# Patient Record
Sex: Female | Born: 1985 | Race: White | Hispanic: No | Marital: Married | Smoking: Never smoker
Health system: Southern US, Community
[De-identification: ages and names within clinical notes are randomized; demographics above are authoritative.]

## PROBLEM LIST (undated history)

## (undated) DIAGNOSIS — F419 Anxiety disorder, unspecified: Secondary | ICD-10-CM

## (undated) DIAGNOSIS — K50813 Crohn's disease of both small and large intestine with fistula: Secondary | ICD-10-CM

## (undated) DIAGNOSIS — J45909 Unspecified asthma, uncomplicated: Secondary | ICD-10-CM

## (undated) HISTORY — PX: PILONIDAL CYST EXCISION: SHX744

## (undated) HISTORY — DX: Crohn's disease of both small and large intestine with fistula: K50.813

## (undated) HISTORY — PX: COLONOSCOPY: SHX174

## (undated) HISTORY — PX: BARTHOLIN GLAND CYST EXCISION: SHX565

## (undated) HISTORY — PX: WISDOM TOOTH EXTRACTION: SHX21

---

## 2005-03-31 ENCOUNTER — Ambulatory Visit (HOSPITAL_COMMUNITY): Admission: RE | Admit: 2005-03-31 | Discharge: 2005-03-31 | Payer: Self-pay | Admitting: *Deleted

## 2005-03-31 ENCOUNTER — Encounter (INDEPENDENT_AMBULATORY_CARE_PROVIDER_SITE_OTHER): Payer: Self-pay | Admitting: *Deleted

## 2005-03-31 ENCOUNTER — Ambulatory Visit (HOSPITAL_BASED_OUTPATIENT_CLINIC_OR_DEPARTMENT_OTHER): Admission: RE | Admit: 2005-03-31 | Discharge: 2005-03-31 | Payer: Self-pay | Admitting: *Deleted

## 2006-05-27 ENCOUNTER — Inpatient Hospital Stay (HOSPITAL_COMMUNITY): Admission: AD | Admit: 2006-05-27 | Discharge: 2006-05-29 | Payer: Self-pay | Admitting: Obstetrics and Gynecology

## 2006-05-28 ENCOUNTER — Encounter (INDEPENDENT_AMBULATORY_CARE_PROVIDER_SITE_OTHER): Payer: Self-pay | Admitting: *Deleted

## 2006-09-15 ENCOUNTER — Encounter (INDEPENDENT_AMBULATORY_CARE_PROVIDER_SITE_OTHER): Payer: Self-pay | Admitting: *Deleted

## 2006-09-15 ENCOUNTER — Ambulatory Visit (HOSPITAL_COMMUNITY): Admission: RE | Admit: 2006-09-15 | Discharge: 2006-09-15 | Payer: Self-pay | Admitting: Obstetrics and Gynecology

## 2007-12-08 ENCOUNTER — Other Ambulatory Visit: Admission: RE | Admit: 2007-12-08 | Discharge: 2007-12-08 | Payer: Self-pay | Admitting: Obstetrics and Gynecology

## 2008-12-08 ENCOUNTER — Other Ambulatory Visit: Admission: RE | Admit: 2008-12-08 | Discharge: 2008-12-08 | Payer: Self-pay | Admitting: Obstetrics and Gynecology

## 2010-01-21 ENCOUNTER — Other Ambulatory Visit: Admission: RE | Admit: 2010-01-21 | Discharge: 2010-01-21 | Payer: Self-pay | Admitting: Obstetrics and Gynecology

## 2011-02-14 NOTE — Op Note (Signed)
NAMEMarland Kitchen  Alexandria Garcia, Alexandria Garcia NO.:  [id number]   MEDICAL RECORD NO.:  [id number]          PATIENT TYPE:  INP   LOCATION:  A326                          FACILITY:  [facility]   PHYSICIAN:  Tilda Burrow, M.D. DATE OF BIRTH:  14-Mar-1986   DATE OF PROCEDURE:  05/28/2006  DATE OF DISCHARGE:                                 OPERATIVE REPORT   PREOPERATIVE DIAGNOSIS:  Right Bartholin's abscess.   POSTOPERATIVE DIAGNOSIS:  Right Bartholin's abscess.   OPERATION PERFORMED:  Excision of Bartholin's cyst, right labia majora.   SURGEON:  Tilda Burrow, M.D.   ASSISTANTAmie Critchley, CST   ANESTHESIA:  General.   COMPLICATIONS:  None.   FINDINGS:  Fibrotic tract from external drainage site to internal drainage  site just at hymen remnants.   DESCRIPTION OF PROCEDURE:  The patient was taken to the operating room and  placed in high lithotomy leg support and prepped and draped.  The perineum  was examined while the patient was anesthetized and the firm fibrotic tract  delineated.  A hemostat could be used to cannulate the tract originating  from the opening up nearest to the hymen on the right side. This reached  almost to the external opening.  It was felt that excision would be  preferable to drainage efforts.  Therefore a semicircular 3 cm incision was  made over the external opening coring out the fistulous tract and the  fibrotic area surrounding it.  Once the ellipse was allowed the development  of a flap of skin, we were able to undermine and resect and core out a 4 cm  long by 3 cm wide tubular mass of fibrous tissue.  We did this while  maintaining hemostat in the internal opening so that we could maintain  orientation.  Once this was cored out almost completely.  We cut the  specimen open in order to ensure that we were peeling out the entire  opening.  It appeared we were taking out the entire Bartholin's abscess and  surrounding fibrotic tissue.  Once this had been  completed, there was some  pulsatile arterial bleeding that required two horizontal mattress sutures of  3-0 Dexon to achieve some hemostasis.  The defect was then closed with a  series of interrupted 3-0 and 2-0 Dexon sutures rebuilding the perineal  body.  Subcuticular 3-0 Dexon was then used to close the external opening.  The internal opening was trimmed in such a way as to reduce some retraction  that had been present due to its chronic nature.  This was cored out  sufficiently and loosely put together with suture to allow blood and fluid  egress if necessary but allow tissue edges to come in close enough  approximation to heal smoothly.  There was only slight asymmetry of the  introitus due to the scarring and surgical correction.  The estimated blood  loss was 50 mL.  Sponge and needle counts were correct.   ADDENDUM:  After the end of the procedure, the patient's pilonidal cyst was  inspected, massaged enough to express some of the purulence but no  cannulization performed.  The patient will see Dr. Lovell Sheehan in anticipation  of a Christmas break surgery to correct this.      Tilda Burrow, M.D.  Electronically Signed     JVF/MEDQ  D:  05/28/2006  T:  05/29/2006  Job:  [id number]   cc:   Dalia Heading, M.D.  Fax: [phone]   __________, OB/GYN

## 2011-02-14 NOTE — H&P (Signed)
NAMEMarland Kitchen  Alexandria Garcia, Alexandria Garcia NO.:  [id number]   MEDICAL RECORD NO.:  [id number]          PATIENT TYPE:  AMB   LOCATION:  DAY                           FACILITY:  [facility]   PHYSICIAN:  Tilda Burrow, M.D. DATE OF BIRTH:  June 02, 1986   DATE OF ADMISSION:  09/15/2006  DATE OF DISCHARGE:  LH                              HISTORY & PHYSICAL   ADMISSION DIAGNOSES:  Right Bartholin's abscess, recurrent.   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 This has been excised once in  the past after initially incision and drainage in our office November 21, 2005 with surgical removal attempted while it was acutely inflamed  May 27, 2006. Unfortunately, despite our best efforts, the fibrotic  area removed did not heal properly and she has a chronic draining sinus,  just outside the hymen remnant on the right. This does not communicate  with the rectum and is persistently tender. We have allowed it to mature  with several weeks of antibiotic therapy. It does not flare up as long  as she stays on antibiotics. We are going to attempt excision and  drainage at this time. The patient has a history of chronic poor  healing. She has had a total of 4 efforts to remove a pilonidal cyst,  which have been unsuccessful. She does not carry any diagnosis of  autoimmune disorder at the present time.   ALLERGIES:  NO KNOWN DRUG ALLERGIES.   MEDICATIONS:  None other than Ortho-Tri-Cyclen.   PHYSICAL EXAMINATION:  VITAL SIGNS:  Height 5'3. Weight 140.  GENERAL:  Examination shows a tearful Caucasian female, remarkably  accepting of her problems. She is alert and oriented x3.  HEENT:  Pupils are equal, round, and reactive.  NECK:  Supple.  CARDIOVASCULAR:  Unremarkable.  ABDOMEN:  Non-tender.  GENITOURINARY:  External genitalia with the previous scarring from  the  excision efforts done in the past. The right side has two skin scars,  one for the chronic draining sinus, just outside the introitus and a  separate scar that has healed over, approximately 2 cm out lateral on  the right buttock. There is no perirectal induration or tenderness. This  is not felt to be rectovaginal in origin, but either a sebaceous cyst or  more likely, the Bartholin's abscess with poor  healing. At this time, her pilonidal cyst is asymptomatic and only  drains intermittently and patient declines offer of correcting this at  the same time through surgical consultation.   PLAN:  Excision of Bartholin's abscess and fistulous sinus on September 15, 2006 at 7:30 a.m.      Tilda Burrow, M.D.  Electronically Signed     JVF/MEDQ  D:  09/09/2006  T:  09/09/2006  Job:  [id number]   cc:   [facility]   Dalia Heading, M.D.  Fax: [phone]   [facility]

## 2011-02-14 NOTE — Discharge Summary (Signed)
NAMEMarland Kitchen  KATHE, WIRICK NO.:  [id number]   MEDICAL RECORD NO.:  [id number]          PATIENT TYPE:  INP   LOCATION:  A326                          FACILITY:  [facility]   PHYSICIAN:  Tilda Burrow, M.D. DATE OF BIRTH:  [birth year]   DATE OF ADMISSION:  05/27/2006  DATE OF DISCHARGE:  08/31/2007LH                                 DISCHARGE SUMMARY   ADMISSION DIAGNOSIS:  Recurrent right Bartholin's abscess.   DISCHARGE DIAGNOSIS:  Recurrent right Bartholin's abscess.   PROCEDURES:  Incision and drainage of Bartholin's abscess May 28, 2006.   DISCHARGE MEDICATIONS:  1. Keflex 500 q.i.d. x14 days.  2. Tylox 20 tablets one q.4h p.r.n. severe pain.  3. Motrin 800 mg q.8 h p.r.n. mild pain.  4. Diflucan 150 mg weekly x2.  5. Ortho Tri-Cyclen load to be continued.   HOSPITAL SUMMARY:  This 25 year old gravida 0, para 0 on Ortho Tri-Cyclen  load with know recurrent Bartholin's abscess was admitted for pain  management after we could not adequately address the current Bartholin's  abscess induration as an outpatient.  She received IV antibiotics overnight,  was afebrile, had hemoglobin of 12, hematocrit 35, was taken to the OR the  next day for excision of right Bartholin's' abscess.  The wound was closed  at the time of surgery after irrigation.  The patient was doing well enough  to go home the next day with follow up scheduled for two weeks.  She intends  to go back to school on Tuesday.  Will be calling our office for any changes  in status or worsening of discomfort today.  At discharge, she looks fine,  is afebrile, tolerating a regular diet with minimal discomfort.      Tilda Burrow, M.D.  Electronically Signed     JVF/MEDQ  D:  05/29/2006  T:  05/29/2006  Job:  [id number]   cc:   [facility]

## 2011-02-14 NOTE — H&P (Signed)
NAMEMarland Garcia  CARRI, SPILLERS NO.:  [id number]   MEDICAL RECORD NO.:  [id number]          PATIENT TYPE:  INP   LOCATION:  A326                          FACILITY:  [facility]   PHYSICIAN:  Tilda Burrow, M.D. DATE OF BIRTH:  1986-09-16   DATE OF ADMISSION:  05/27/2006  DATE OF DISCHARGE:  LH                                HISTORY & PHYSICAL   ADMISSION DIAGNOSES:  1. Recurrent right Bartholin's abscess.  2. Recurrent pilonidal cyst.   HISTORY OF PRESENT ILLNESS:  This 25 year old female, gravida 0, para 0, LMP  May 06, 2006, on Ortho Tri-Cyclen Lo, student at [employer] is  admitted at this time for incision and drainage of Bartholin's abscess.  She  is seen in the office today, and the cyst was just too tender to effectively  treat in the office.  She had been seen in February of this year with what  appeared to be a gluteal abscess.  This was incised and drained.  She was  seen subsequently by Dr. _Mcleod__ and had a Bartholin's abscess further up  which has been drained.  This was treated without a wick or Word catheter.  The patient has subsequently had recurrence of inflammation, tenderness, and  swelling.  Anatomy does not allow [place] to access the area of involvement  easily.  She is exquisitely tender, and we cannot get good opening into the  abscess pocket.  Local anesthesia efforts in the office were unsuccessful  due to the severity of the pain, so we are admitting her for antibiotic  therapy overnight and incision and drainage in the OR in a.m.  Additionally,  she has a recurrent pilonidal cyst despite 4 efforts already in 2005 and  2006, and this will be evaluated by Dr. Lovell Sheehan.  We may leave this to a  later date, depending on findings and decision.   The patient is a Consulting civil engineer at [employer].  School is just beginning, and  she does not wish to be out of school for prolonged period of time if  possible.  Once before she had to have daily packings of  pilonidal cyst  incision and drainage for several weeks.  Unfortunately, the cyst recurred.   PAST MEDICAL HISTORY:  Benign other than the cyst and Bartholin's abscess.   PAST SURGICAL HISTORY:  Pilonidal cystectomy September 2005, November 2005,  June 2006, and July 2006.  She has had recurrence, nonetheless, of pilonidal  cyst.   INJURIES:  None.   ALLERGIES:  None.   MEDICATIONS:  Ortho Tri-Cyclen Lo.   PHYSICAL EXAMINATION:  VITAL SIGNS:  Height 5 feet 3 inches, weight 146.  Blood pressure 110/62.  GENERAL:  She is a moderately uncomfortable Caucasian female.  HEENT:  Pupils equal, round, and reactive.  NECK:  Supple.  CARDIOVASCULAR:  Exam unremarkable.  ABDOMEN:  Nontender.  PELVIC:  External genitalia show a draining pilonidal cyst without a huge  amount of erythema, evidence of prior surgical treatments.  The right labia  majora and perineal body is swollen, and there is a drainage site that has  already been  massaged and is expressing purulent material.  Additionally,  purulent material was seen at the pilonidal cyst site and expressed these.  The two sites do not communicate.  EXTREMITIES: Grossly normal.   IMPRESSION:  1. Recurrent pilonidal cyst.  2. Recurrent right Bartholin's abscess.   PLAN:  We will focus on trying the eliminate the recurrences of the  Bartholin's abscess.  Possibility of surgical excision of the area of  induration and infection as well as possibility of simple incision and  drainage with replacement of Word catheter have been reviewed with patient.  We will decide intraoperatively depending on the clinical findings and  success of dissection.      Tilda Burrow, M.D.  Electronically Signed     JVF/MEDQ  D:  05/27/2006  T:  05/27/2006  Job:  [id number]

## 2011-02-14 NOTE — Op Note (Signed)
NAMEMarland Kitchen  Garcia, Alexandria Garcia            ACCOUNT NO.:  [id number]   MEDICAL RECORD NO.:  [id number]          PATIENT TYPE:  AMB   LOCATION:  NESC                         [facility]ACILITY[facility]   PHYSICIAN:  Vikki Ports, MDDATE OF BIRTH:  1986-08-13   DATE OF PROCEDURE:  03/31/2005  DATE OF DISCHARGE:                                 OPERATIVE REPORT   PREOPERATIVE DIAGNOSIS:  Recurrent pilonidal cyst.   POSTOPERATIVE DIAGNOSIS:  Recurrent pilonidal cyst.   PROCEDURE:  Recurrent pilonidal cystectomy.   SUBJECTIVE:  Vikki Ports, MD.   ANESTHESIA:  General.   DESCRIPTION OF PROCEDURE:  The patient was taken to the operating room and  placed in supine position.  After adequate anesthesia was induced using  endotracheal  tube, the patient was placed in the prone position.  Superior  gluteal cuff was prepped and draped in normal sterile fashion.  Using an  elliptical  incision around the previous scar, I dissected down onto the  coccygeal fascia and sacral fascia excising all tissue in this area.  This  was removed in block and adequate hemostasis was ensured.  Flaps were  created, and it was closed in two layers using interrupted 2-0 Vicryl in the  deep layer, and the skin was interrupted vertical mattress sutures with 2-0  nylon.  I also used some staples in the skin.  Sterile dressing was applied.  The patient tolerated the procedure well, went to PACU in good condition.       KRH/MEDQ  D:  03/31/2005  T:  03/31/2005  Job:  [id number]

## 2011-02-14 NOTE — Op Note (Signed)
NAMEMarland Kitchen  Alexandria, Garcia NO.:  [id number]   MEDICAL RECORD NO.:  [id number]          PATIENT TYPE:  AMB   LOCATION:  DAY                           FACILITY:  [facility]   PHYSICIAN:  Tilda Burrow, M.D. DATE OF BIRTH:  1986/07/23   DATE OF PROCEDURE:  09/15/2006  DATE OF DISCHARGE:  09/15/2006                               OPERATIVE REPORT   PREOPERATIVE DIAGNOSIS:  Recurrent Bartholin's abscess/ovarian cyst.   POSTOPERATIVE DIAGNOSIS:  Recurrent Bartholin's abscess/ovarian cyst.   OPERATION/PROCEDURE:  Excision of ovarian cyst sinus tract.   SURGEON:  Tilda Burrow, M.D.   ASSISTANT:  __________ , CST.   ANESTHESIA:  General.   COMPLICATIONS:  None.   SPECIMENS:  To lab.   ESTIMATED BLOOD LOSS:  Minimal.   DESCRIPTION OF PROCEDURE:  The patient was taken to the operating room,  prepped and draped for vulvar procedure.  The area of the prior  recurrent vulvar abscess (? Bartholin's abscess) was easily visible on  the right side of the introitus with the most proximal opening just  outside the hymen remnants and the distal opening, approximately 2.5 cm  external and lateral.  There was minimal erythema and the patient had  some small amount of purulent drainage from the more proximal opening.  The external scarring was closed and was not draining.  Elliptical  incisions were made around each of the openings both proximal and  distal.  We then worked from the most distal and lateral site coring out  the fibrous fistulous tract, leaving fatty tissue that was clear and  healthy in appearance all the way around it.  This was cored out and an  Allis clamp used to place traction on it so that we could completely  remove the fistulous tract.  At the end of the dissection, only fatty  tissue remained and the fistulous tract was removed intact from one end  to the other.  The area was irrigated copiously, then the potential  space obliterated  by subcuticular 3-0  Dexon sutures used to close potential space.  The  skin incisions were closed with subcuticular 4-0 Dexon.  Lateral tibial  plateau tolerated the procedure well and went to the recovery room in  good condition.  Specimen was sent for histology.      Tilda Burrow, M.D.  Electronically Signed     JVF/MEDQ  D:  10/09/2006  T:  10/09/2006  Job:  [id number]

## 2011-05-05 ENCOUNTER — Other Ambulatory Visit: Payer: Self-pay | Admitting: Adult Health

## 2011-05-05 ENCOUNTER — Other Ambulatory Visit (HOSPITAL_COMMUNITY)
Admission: RE | Admit: 2011-05-05 | Discharge: 2011-05-05 | Disposition: A | Discharge status: Home / Self Care | Payer: Commercial Indemnity | Source: Ambulatory Visit | Attending: Obstetrics and Gynecology | Admitting: Obstetrics and Gynecology

## 2011-05-05 DIAGNOSIS — Z113 Encounter for screening for infections with a predominantly sexual mode of transmission: Secondary | ICD-10-CM | POA: Insufficient documentation

## 2011-05-05 DIAGNOSIS — Z01419 Encounter for gynecological examination (general) (routine) without abnormal findings: Secondary | ICD-10-CM | POA: Insufficient documentation

## 2012-02-19 ENCOUNTER — Other Ambulatory Visit: Payer: Self-pay | Admitting: Gastroenterology

## 2012-02-19 DIAGNOSIS — L988 Other specified disorders of the skin and subcutaneous tissue: Secondary | ICD-10-CM

## 2012-02-29 ENCOUNTER — Ambulatory Visit
Admission: RE | Admit: 2012-02-29 | Discharge: 2012-02-29 | Disposition: A | Discharge status: Home / Self Care | Payer: Commercial Indemnity | Source: Ambulatory Visit | Attending: Gastroenterology | Admitting: Gastroenterology

## 2012-02-29 DIAGNOSIS — L988 Other specified disorders of the skin and subcutaneous tissue: Secondary | ICD-10-CM

## 2012-02-29 MED ORDER — GADOBENATE DIMEGLUMINE 529 MG/ML IV SOLN
14.0000 mL | Freq: Once | INTRAVENOUS | Status: AC | PRN
  Administered 2012-02-29: 14 mL via INTRAVENOUS

## 2014-04-06 ENCOUNTER — Other Ambulatory Visit (HOSPITAL_COMMUNITY): Payer: Self-pay | Admitting: *Deleted

## 2014-04-07 ENCOUNTER — Encounter (HOSPITAL_COMMUNITY)
Admission: RE | Admit: 2014-04-07 | Discharge: 2014-04-07 | Disposition: A | Discharge status: Home / Self Care | Payer: PRIVATE HEALTH INSURANCE | Source: Ambulatory Visit | Attending: Gastroenterology | Admitting: Gastroenterology

## 2014-04-07 DIAGNOSIS — K603 Anal fistula, unspecified: Secondary | ICD-10-CM | POA: Insufficient documentation

## 2014-04-07 DIAGNOSIS — K5090 Crohn's disease, unspecified, without complications: Secondary | ICD-10-CM | POA: Diagnosis not present

## 2014-04-07 MED ORDER — SODIUM CHLORIDE 0.9 % IV SOLN
INTRAVENOUS | Status: DC
  Administered 2014-04-07: 250 mL via INTRAVENOUS

## 2014-04-07 MED ORDER — SODIUM CHLORIDE 0.9 % IV SOLN
5.0000 mg/kg | INTRAVENOUS | Status: DC
  Administered 2014-04-07: 400 mg via INTRAVENOUS
  Filled 2014-04-07: qty 40

## 2014-04-13 ENCOUNTER — Other Ambulatory Visit (HOSPITAL_COMMUNITY): Payer: Self-pay

## 2014-04-14 ENCOUNTER — Encounter (HOSPITAL_COMMUNITY)
Admission: RE | Admit: 2014-04-14 | Discharge: 2014-04-14 | Disposition: A | Discharge status: Home / Self Care | Payer: PRIVATE HEALTH INSURANCE | Source: Ambulatory Visit | Attending: Gastroenterology | Admitting: Gastroenterology

## 2014-04-14 DIAGNOSIS — K5090 Crohn's disease, unspecified, without complications: Secondary | ICD-10-CM | POA: Diagnosis not present

## 2014-04-14 MED ORDER — SODIUM CHLORIDE 0.9 % IV SOLN
5.0000 mg/kg | Freq: Once | INTRAVENOUS | Status: AC
  Administered 2014-04-14: 400 mg via INTRAVENOUS
  Filled 2014-04-14: qty 40

## 2014-04-14 MED ORDER — SODIUM CHLORIDE 0.9 % IV SOLN
INTRAVENOUS | Status: DC
  Administered 2014-04-14: 11:00:00 via INTRAVENOUS

## 2014-05-12 ENCOUNTER — Inpatient Hospital Stay (HOSPITAL_COMMUNITY): Admission: RE | Admit: 2014-05-12 | Payer: Commercial Indemnity | Source: Ambulatory Visit

## 2014-05-19 ENCOUNTER — Encounter (HOSPITAL_COMMUNITY)
Admission: RE | Admit: 2014-05-19 | Discharge: 2014-05-19 | Disposition: A | Discharge status: Home / Self Care | Payer: PRIVATE HEALTH INSURANCE | Source: Ambulatory Visit | Attending: Gastroenterology | Admitting: Gastroenterology

## 2014-05-19 DIAGNOSIS — K5090 Crohn's disease, unspecified, without complications: Secondary | ICD-10-CM | POA: Diagnosis present

## 2014-05-19 DIAGNOSIS — K603 Anal fistula, unspecified: Secondary | ICD-10-CM | POA: Insufficient documentation

## 2014-05-19 MED ORDER — SODIUM CHLORIDE 0.9 % IV SOLN
Freq: Once | INTRAVENOUS | Status: AC
  Administered 2014-05-19: 250 mL via INTRAVENOUS

## 2014-05-19 MED ORDER — SODIUM CHLORIDE 0.9 % IV SOLN
5.0000 mg/kg | Freq: Once | INTRAVENOUS | Status: AC
  Administered 2014-05-19: 400 mg via INTRAVENOUS
  Filled 2014-05-19: qty 40

## 2014-06-02 ENCOUNTER — Encounter (HOSPITAL_COMMUNITY)
Admission: RE | Admit: 2014-06-02 | Discharge: 2014-06-02 | Disposition: A | Discharge status: Home / Self Care | Payer: PRIVATE HEALTH INSURANCE | Source: Ambulatory Visit | Attending: Gastroenterology | Admitting: Gastroenterology

## 2014-06-02 DIAGNOSIS — K603 Anal fistula, unspecified: Secondary | ICD-10-CM | POA: Insufficient documentation

## 2014-06-02 DIAGNOSIS — K5090 Crohn's disease, unspecified, without complications: Secondary | ICD-10-CM | POA: Diagnosis present

## 2014-06-02 MED ORDER — SODIUM CHLORIDE 0.9 % IV SOLN
Freq: Once | INTRAVENOUS | Status: AC
  Administered 2014-06-02: 250 mL via INTRAVENOUS

## 2014-06-02 MED ORDER — SODIUM CHLORIDE 0.9 % IV SOLN
5.0000 mg/kg | Freq: Once | INTRAVENOUS | Status: AC
  Administered 2014-06-02: 400 mg via INTRAVENOUS
  Filled 2014-06-02: qty 40

## 2014-07-28 ENCOUNTER — Inpatient Hospital Stay (HOSPITAL_COMMUNITY): Admission: RE | Admit: 2014-07-28 | Payer: Commercial Indemnity | Source: Ambulatory Visit

## 2014-09-05 ENCOUNTER — Encounter (INDEPENDENT_AMBULATORY_CARE_PROVIDER_SITE_OTHER): Payer: Self-pay

## 2014-09-05 ENCOUNTER — Encounter: Payer: Self-pay | Admitting: Gastroenterology

## 2014-09-05 ENCOUNTER — Ambulatory Visit (INDEPENDENT_AMBULATORY_CARE_PROVIDER_SITE_OTHER): Payer: PRIVATE HEALTH INSURANCE | Admitting: Gastroenterology

## 2014-09-05 VITALS — BP 121/78 | HR 86 | Temp 98.1°F | Ht 63.0 in | Wt 171.6 lb

## 2014-09-05 DIAGNOSIS — K50813 Crohn's disease of both small and large intestine with fistula: Secondary | ICD-10-CM

## 2014-09-05 NOTE — Patient Instructions (Signed)
1. I will discuss your case with Dr. Oneida Alar and get back in touch with you with further recommendations once Dr. Oneida Alar is back in town. 2. Call or send message via MyChart with interim questions or concerns.

## 2014-09-05 NOTE — Progress Notes (Addendum)
Primary Care Physician:  Gar Ponto, MD  Primary Gastroenterologist:  Barney Drain, MD   Chief Complaint  Patient presents with  . Crohn's Disease    HPI:  Alexandria Garcia is a 28 y.o. female here to establish care for Crohn's disease. She is requesting Dr. Oneida Alar.   Patient states she was diagnosed with ileocolonic fistulizing Crohn's disease in 2013. She had a history of recurrent "Bartholin's gland cyst" dating back to 2006. She had excision twice in 2007. Also with pilonidal cyst excision in 2006. She began having recurrent issues with perineal cyst with drainage and abscess formation in 2013 at which time she saw a different gynecologist who suspected Crohn's disease with fistula. Patient was referred to Dr. Juanita Craver, gastroenterologist. MRI of the pelvis was obtained which showed apparent fistulous tract connecting the right perineum to the anorectal region suggesting grade 3 intersphincteric fistula. Patient then underwent ileocolonoscopy which revealed multiple ulcers in the TI, patchy inflammation and scarring in the rectum with pus? Colonic Crohn's with fistula. Biopsies were consistent with Crohn's disease.    Patient initially was started on Imuran, she took this proximally 6 months before she was switched to Humira every other week due to persistent fistula. In June of this year she was switched to Remicade again for refractory fistula. She reports she actually had a smaller fistula which occurred but has resolved but the original fistula has been present for 2 years at this time. Patient never really had other symptoms suggestive of Crohn's disease. She notes more crampiness in the abdomen since her diagnosis. Really denies any diarrhea. Generally has 1-2 bowel movements daily with occasional increased frequency related to certain foods especially roughage. Denies any blood in the stool or melena.  Last episode of Remicade on 06/02/2014. She had plans for 8 week follow-up  infusion however her insurance changed and probably authorization fell through at her previous GI clinic in delayed care. Subsequently she decided to switch gastroenterologist in part for this reason. She received high recommendations for Dr. Oneida Alar from a friend and patient lives closer to our facility will like to pursue care here.  Patient is also interested in becoming pregnant. She is not using any form of birth control at this time. Last menstrual period November 23.  Recently noted some shortness of breath. History of juvenile asthma but has not been bothered with it. Saw her PCP who ordered a d-dimer which was elevated per patient. She describes having a chest [facility] at [facility] which was negative. There is been some discussion between the patient and her PCP that her d-dimer elevation may be related to inflammation/Crohn's disease.  At this time patient states her fistula flares intermittently. Generally has some drainage at least once per week. Due to the location and makes intercourse painful. Notes flares with certain foods especially sweets.    Current Outpatient Prescriptions  Medication Sig Dispense Refill  . Fluticasone-Salmeterol (ADVAIR) 100-50 MCG/DOSE AEPB Inhale 1 puff into the lungs 2 (two) times daily.    . Multiple Vitamin (MULTIVITAMIN) capsule Take 1 capsule by mouth daily.    Marland Kitchen inFLIXimab in sodium chloride 0.9 % Inject into the vein.     No current facility-administered medications for this visit.    Allergies as of 09/05/2014  . (No Known Allergies)    Past Medical History  Diagnosis Date  . 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 Pilonidal cyst excision      X 3  . Bartholin gland cyst excision      X 2  . Colonoscopy  02/2012    Dr. Juanita Craver: Terminal ileum had multiple ulcers with pathology consistent with chronic active ileitis  suggestive of Crohn's disease. Patchy erythema and scarring in the rectum with pus possibly from colonic Crohn's and fistula, biopsy negative    Family History  Problem Relation Age of Onset  . Inflammatory bowel disease Neg Hx   . Celiac disease Neg Hx   . Colon cancer Neg Hx     History   Social History  . Marital Status: Single    Spouse Name: N/A    Number of Children: N/A  . Years of Education: N/A   Occupational History  . Not on file.   Social History Main Topics  . Smoking status: Never Smoker   . Smokeless tobacco: Not on file  . Alcohol Use: Not on file  . Drug Use: Not on file  . Sexual Activity: Not on file   Other Topics Concern  . Not on file   Social History Narrative  . No narrative on file      ROS:  General: Negative for anorexia, weight loss, fever, chills, fatigue, weakness. Eyes: Negative for vision changes.  ENT: Negative for hoarseness, difficulty swallowing , nasal congestion. CV: Negative for chest pain, angina, palpitations, dyspnea on exertion, peripheral edema.  Respiratory: Negative for dyspnea at rest, dyspnea on exertion, cough, sputum, wheezing.  GI: See history of present illness. GU:  Negative for dysuria, hematuria, urinary incontinence, urinary frequency, nocturnal urination.  MS: Negative for joint pain, low back pain.  Derm: Negative for rash or itching.  Neuro: Negative for weakness, abnormal sensation, seizure, frequent headaches, memory loss, confusion.  Psych: Negative for anxiety, depression, suicidal ideation, hallucinations.  Endo: Negative for unusual weight change.  Heme: Negative for bruising or bleeding. Allergy: Negative for rash or hives.    Physical Examination:  BP 121/78 mmHg  Pulse 86  Temp(Src) 98.1 F (36.7 C)  Ht 5\' 3"  (1.6 m)  Wt 171 lb 9.6 oz (77.837 kg)  BMI 30.41 kg/m2  LMP 08/23/2014   General: Well-nourished, well-developed in no acute distress.  Head: Normocephalic, atraumatic.   Eyes:  Conjunctiva pink, no icterus. Mouth: Oropharyngeal mucosa moist and pink , no lesions erythema or exudate. Neck: Supple without thyromegaly, masses, or lymphadenopathy.  Lungs: Clear to auscultation bilaterally.  Heart: Regular rate and rhythm, no murmurs rubs or gallops.  Abdomen: Bowel sounds are normal, nontender, nondistended, no hepatosplenomegaly or masses, no abdominal bruits or    hernia , no rebound or guarding.   Rectal: Examination of the perineal area reveals 3-4 mm indurated area just right and posterior the vaginal opening with some scarring. Slight tenderness. No active drainage at this time. Extremities: No lower extremity edema. No clubbing or deformities.  Neuro: Alert and oriented x 4 , grossly normal neurologically.  Skin: Warm and dry, no rash or jaundice.   Psych: Alert and cooperative, normal mood and affect.

## 2014-09-05 NOTE — Assessment & Plan Note (Signed)
28 year old lady with two-year history Crohn's disease involving the small and large intestines with fistula as described above. Fistula present for 2 years duration with no documented healing. Patient seeks to establish care with Dr. Oneida Alar for her Crohn's disease, currently not on any medication for such. Last dose of Remicade on 06/02/2014. Patient really denies any prior history of abdominal pain or bowel issues, presenting with history of fistula at initial diagnosis. She is interested in pursuing pregnancy at this time, previously had waited trying to ensure healing of fistula prior to pregnancy but has become frustrated with no apparent improvement. Discuss case further with Dr. Oneida Alar. Further recommendations to follow.

## 2014-09-06 NOTE — Progress Notes (Signed)
cc'ed to pcp °

## 2014-09-18 ENCOUNTER — Telehealth: Payer: Self-pay | Admitting: General Practice

## 2014-09-18 NOTE — Telephone Encounter (Signed)
Patient wanted to follow-up with Magda Paganini to see if she had spoken with Dr. Oneida Alar on recommendations concerning her Crohns disease.

## 2014-09-20 NOTE — Telephone Encounter (Signed)
Pt called and said she signed release forms for Dr. Collene Mares before she came in and that should still be on file. Manuela Schwartz, please get those reports that Magda Paganini is asking for. Thanks!

## 2014-09-20 NOTE — Telephone Encounter (Addendum)
Discussed patient at length with Dr. Oneida Alar. Discussed treatment options with patient. Previously she took Imuran for 6 months without notable improvement. She was on Humira for a period of time but eventually switched to Remicade in June or July of this year. She had doses at weeks 0, 1, 2, 6, 8 and then had delayed in treatment due to prior authorization issues with her insurance company. She was receiving 5 mg/kg. 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 She is very much interested in pursuing pregnancy and her.  Recommendations included combination therapy with Imuran and Remicade. Patient would like to avoid Imuran due to pregnancy category for in her desire for pregnancy within the next 6 months to one year. We'll pursue Remicade infusion only, 10 mg/kg every 8 weeks. I will retrieve records from Dr. Collene Mares regarding previous TMPT enzyme activity levels. Plan on tertiary care referral, patient may be interested in going to IBD specialist at [facility] rather than [facility]. Will plan on referral the near future in case she does not show significant improvement, appointments can take up to 3 months to be seen. Patient to make decision for referral process and call us next week.  1. Remicade 10 mg/kg IV infusion every 8 weeks. Please start insurance approval. 2. Office visit in 8 weeks with Dr. Oneida Alar. 3. Please request documentation from Dr. Lorie Apley office for Hep B surface antigen, TB testing, TMPT enzyme activity, last 3 OV notes.

## 2014-09-20 NOTE — Telephone Encounter (Signed)
Tried to call pt. LMOM for a return call. ( Looks like she might have to sign for the tests results from Dr. Collene Mares)

## 2014-09-20 NOTE — Telephone Encounter (Signed)
Pt called back to find out if LSL had talked with [facility]. She said that she can be reached at  [phone]

## 2014-09-20 NOTE — Telephone Encounter (Signed)
REVIEWED. AGREE. NO ADDITIONAL RECOMMENDATIONS. 

## 2014-09-25 ENCOUNTER — Encounter: Payer: Self-pay | Admitting: Gastroenterology

## 2014-09-25 NOTE — Telephone Encounter (Signed)
Patient signed a release of records prior to her first OV with [place] to get records. Those records have been sent. I do not have the original signed release and will have to get another one since this will be for something different.

## 2014-09-25 NOTE — Telephone Encounter (Signed)
Orders faxed to [facility] for the Remicade.

## 2014-09-25 NOTE — Telephone Encounter (Signed)
Appt made with SF in 8 weeks and appt letter and release of information form have been mailed to patient. She is to return form back to me so I can retrieve those records.

## 2014-09-25 NOTE — Telephone Encounter (Signed)
T/C from Elisa at [facility] and she received the orders and needs info about the PA. Almyra Free said she will be working on it and Elisa is ready to schedule when we find out about the PA.

## 2014-09-27 NOTE — Telephone Encounter (Signed)
PA and rx information sent to [facility] yesterday. I received a fax from them today, the pts insurance requires she use their specialty pharmacy. Todd at [facility] [employer] has forwarded all the information to them.

## 2014-10-03 NOTE — Telephone Encounter (Signed)
I called pt and she is aware that we are waiting to hear about the Remicade.  She would like to be referred to GI at [facility], Dr. Ezekiel Slocumb.

## 2014-10-03 NOTE — Telephone Encounter (Signed)
PA has been done and sent to the insurance company. I tried to call them yesterday and could not get a "person" on the phone to answer

## 2014-10-03 NOTE — Telephone Encounter (Signed)
I called Alexandria Garcia and they said the claim was still pending at this time.

## 2014-10-03 NOTE — Telephone Encounter (Signed)
Alexandria Free, do we know status of Remicade yet.   Doris, can you please make sure we touch base with patient to see if she decided where she wants tertiary care referal to IBD specialist [facility] or [facility]).

## 2014-10-04 NOTE — Telephone Encounter (Signed)
pts Remicade has been approved and we have placed the order with the [facility]. I just received paperwork to order from the specialty pharmacy today.  It will be shipped to [place] [facility]. Christina at [facility] is aware that we have been waiting for PA and to order Remicade. Routing to [employer] for [employer].

## 2014-10-04 NOTE — Telephone Encounter (Signed)
Please send referral to Dr. Ezekiel Slocumb at [facility]. Patient wants to see Dr. Kennith Gain only. Dx: Crohn's.

## 2014-10-05 ENCOUNTER — Other Ambulatory Visit: Payer: Self-pay

## 2014-10-05 DIAGNOSIS — K50919 Crohn's disease, unspecified, with unspecified complications: Secondary | ICD-10-CM

## 2014-10-05 NOTE — Telephone Encounter (Signed)
Referral has been faxed.

## 2014-10-09 NOTE — Telephone Encounter (Signed)
T/C from pt. She called [facility] and they said the appt should be made so the medication will be delivered. They scheduled her appt for this Friday 10/13/2014.  Now she is concerned, she thought she was to have like a loading dose to start with and then the regular doses of the Remicade.  She is also afraid that she did not relay the message to Neil Crouch, PA correctly about previously being on the 5 mg and it not helping. She thinks the reason it did not help was because of the scheduling and being being consistent.  Now she is afraid if she gets the 10 mg that she will have a bad reaction.  Pt can be reached at [phone] and would like to discuss her concerns.

## 2014-10-09 NOTE — Telephone Encounter (Signed)
Pt is aware to call [facility] tomorrow morning at 8:00 AM.

## 2014-10-09 NOTE — Telephone Encounter (Signed)
REVIEWED. AGREE. NO ADDITIONAL RECOMMENDATIONS. 

## 2014-10-09 NOTE — Telephone Encounter (Signed)
LMOAM for patient to return my call. I will likely not be here when she calls back this afternoon and suggested she call at 8am in the morning so I can talk with her.

## 2014-10-10 ENCOUNTER — Encounter: Payer: Self-pay | Admitting: Internal Medicine

## 2014-10-10 NOTE — Telephone Encounter (Signed)
START REMICADE AT 5 MG/KG AND PREMED PT. MAKE PT AWARE IMMUNE REACTION IS STILL POSSIBLE WITH PREMEDS DUE TO THE LAPSE IN HER TREATMENT.

## 2014-10-10 NOTE — Telephone Encounter (Signed)
Discussed with patient. She had questions about whether or not she should have start up dose, on wk 0,2,6 or just go straight into every 8 week dosing. A little apprehensive in 10mg /kg dosing due to concern about if she would have any way to increase later if she lost response.   Currently scheduled for Friday the 15th for first infusion. Her last remicade dose was 05/2014, interrupted due to insurance P.A. Not being completed timely by her last GI.  I told patient I would discuss with Dr. Oneida Alar to make sure everything as planned is most appropriate for the patient.

## 2014-10-11 NOTE — Telephone Encounter (Signed)
New orders have been faxed to the hospital with a note to see change in orders.  Almyra Free will check on the insurance today.

## 2014-10-11 NOTE — Telephone Encounter (Signed)
I called Alexandria Garcia and spoke to [name] in the clinical area and she said they had received the new orders for the Remicade.  She is aware that pt was scheduled for Friday.

## 2014-10-11 NOTE — Telephone Encounter (Signed)
I have spoken to the patient and she is aware of Dr. Nona Dell new recommendations. Patient is aware that Almyra Free is going to contact insurance about change.

## 2014-10-11 NOTE — Telephone Encounter (Signed)
I called [facility]. I spoke with Alexandria Garcia and the pharmacist and informed them of the change in the dosage. I asked if the medication has been shipped yet and they said no. I informed them that the pt was scheduled to receive it on Friday 10/13/14. They let me speak to Alexandria Garcia in pharmacy services and she confirmed the rx and said she will need to call [facility] and confirmed it with them and confirm it with the pt and then it will be shipped. She said they should have it at the pharmacy tomorrow. The reference # is [id number]. I called Alexandria Garcia at [facility] and informed her of what was going on with the pts rx.

## 2014-10-11 NOTE — Telephone Encounter (Signed)
Alexandria Garcia, can you see if there will be any problems if we change her dose of Remicade (regarding insurance). She is scheduled for Friday.

## 2014-10-12 NOTE — Telephone Encounter (Addendum)
Candy Sledge from [facility] called- they have not received the pts remicade. I called the specialty pharmacy- they tried to call the pt and left her a message and she has not called them back yet. I spoke with Anderson Malta, she said if the pt calls them today and verifies the order, they can have it delivered to the pharmacy at [facility] early in the AM, around 7am. I called the pt, she says she has not heard from the pharmacy, I gave her the phone number to call and she said she would call now. I called back to [facility] and spoke with Maudie Mercury, she wants pt to call her [phone]) at 8am to make sure the remicade has arrived. I tried to call the pt and got NA- LM on voicemail- gave her Kim's name and number and asked her to call her at 8am to make sure the remicade has arrived.

## 2014-10-13 ENCOUNTER — Encounter (HOSPITAL_COMMUNITY)
Admission: RE | Admit: 2014-10-13 | Discharge: 2014-10-13 | Disposition: A | Discharge status: Home / Self Care | Payer: No Typology Code available for payment source | Source: Ambulatory Visit | Attending: Gastroenterology | Admitting: Gastroenterology

## 2014-10-13 ENCOUNTER — Encounter (HOSPITAL_COMMUNITY): Payer: Self-pay

## 2014-10-13 DIAGNOSIS — K50813 Crohn's disease of both small and large intestine with fistula: Secondary | ICD-10-CM | POA: Diagnosis present

## 2014-10-13 MED ORDER — ACETAMINOPHEN 325 MG PO TABS
650.0000 mg | ORAL_TABLET | Freq: Once | ORAL | Status: AC
  Administered 2014-10-13: 650 mg via ORAL

## 2014-10-13 MED ORDER — SODIUM CHLORIDE 0.9 % IV SOLN
Freq: Once | INTRAVENOUS | Status: AC
  Administered 2014-10-13: 250 mL via INTRAVENOUS

## 2014-10-13 MED ORDER — ACETAMINOPHEN 325 MG PO TABS
ORAL_TABLET | ORAL | Status: AC
  Filled 2014-10-13: qty 2

## 2014-10-13 MED ORDER — LORATADINE 10 MG PO TABS
ORAL_TABLET | ORAL | Status: AC
  Filled 2014-10-13: qty 1

## 2014-10-13 MED ORDER — LORATADINE 10 MG PO TABS
10.0000 mg | ORAL_TABLET | Freq: Once | ORAL | Status: AC
  Administered 2014-10-13: 10 mg via ORAL

## 2014-10-13 MED ORDER — METHYLPREDNISOLONE SODIUM SUCC 125 MG IJ SOLR
40.0000 mg | Freq: Once | INTRAMUSCULAR | Status: AC
  Administered 2014-10-13: 40 mg via INTRAVENOUS

## 2014-10-13 MED ORDER — METHYLPREDNISOLONE SODIUM SUCC 125 MG IJ SOLR
INTRAMUSCULAR | Status: AC
  Filled 2014-10-13: qty 2

## 2014-10-13 MED ORDER — SODIUM CHLORIDE 0.9 % IV SOLN
5.0000 mg/kg | INTRAVENOUS | Status: DC
  Administered 2014-10-13: 400 mg via INTRAVENOUS
  Filled 2014-10-13: qty 40

## 2014-10-13 NOTE — Discharge Instructions (Signed)
Infliximab injection °What is this medicine? °INFLIXIMAB (in FLIX i mab) is used to treat Crohn's disease and ulcerative colitis. It is also used to treat ankylosing spondylitis, psoriasis, and some forms of arthritis. °This medicine may be used for other purposes; ask your health care provider or pharmacist if you have questions. °COMMON BRAND NAME(S): Remicade °What should I tell my health care provider before I take this medicine? °They need to know if you have any of these conditions: °-diabetes °-exposure to tuberculosis °-heart failure °-hepatitis or liver disease °-immune system problems °-infection °-lung or breathing disease, like COPD °-multiple sclerosis °-current or past resident of [place] °-seizure disorder °-an unusual or allergic reaction to infliximab, mouse proteins, other medicines, foods, dyes, or preservatives °-pregnant or trying to get pregnant °-breast-feeding °How should I use this medicine? °This medicine is for injection into a vein. It is usually given by a health care professional in a hospital or clinic setting. °A special MedGuide will be given to you by the pharmacist with each prescription and refill. Be sure to read this information carefully each time. °Talk to your pediatrician regarding the use of this medicine in children. Special care may be needed. °Overdosage: If you think you have taken too much of this medicine contact a poison control center or emergency room at once. °NOTE: This medicine is only for you. Do not share this medicine with others. °What if I miss a dose? °It is important not to miss your dose. Call your doctor or health care professional if you are unable to keep an appointment. °What may interact with this medicine? °Do not take this medicine with any of the following medications: °-anakinra °-rilonacept °This medicine may also interact with the following medications: °-vaccines °This list may not describe all possible interactions.  Give your health care provider a list of all the medicines, herbs, non-prescription drugs, or dietary supplements you use. Also tell them if you smoke, drink alcohol, or use illegal drugs. Some items may interact with your medicine. °What should I watch for while using this medicine? °Visit your doctor or health care professional for regular checks on your progress. °If you get a cold or other infection while receiving this medicine, call your doctor or health care professional. Do not treat yourself. This medicine may decrease your body's ability to fight infections. Before beginning therapy, your doctor may do a test to see if you have been exposed to tuberculosis. °This medicine may make the symptoms of heart failure worse in some patients. If you notice symptoms such as increased shortness of breath or swelling of the ankles or legs, contact your health care provider right away. °If you are going to have surgery or dental work, tell your health care professional or dentist that you have received this medicine. °If you take this medicine for plaque psoriasis, stay out of the sun. If you cannot avoid being in the sun, wear protective clothing and use sunscreen. Do not use sun lamps or tanning beds/booths. °What side effects may I notice from receiving this medicine? °Side effects that you should report to your doctor or health care professional as soon as possible: °-allergic reactions like skin rash, itching or hives, swelling of the face, lips, or tongue °-chest pain °-fever or chills, usually related to the infusion °-muscle or joint pain °-red, scaly patches or raised bumps on the skin °-signs of infection - fever or chills, cough, sore throat, pain or difficulty passing urine °-swollen lymph nodes   in the neck, underarm, or groin areas °-unexplained weight loss °-unusual bleeding or bruising °-unusually weak or tired °-yellowing of the eyes or skin °Side effects that usually do not require medical attention  (report to your doctor or health care professional if they continue or are bothersome): °-headache °-heartburn or stomach pain °-nausea, vomiting °This list may not describe all possible side effects. Call your doctor for medical advice about side effects. You may report side effects to FDA at 1-800-FDA-1088. °Where should I keep my medicine? °This drug is given in a hospital or clinic and will not be stored at home. °NOTE: This sheet is a summary. It may not cover all possible information. If you have questions about this medicine, talk to your doctor, pharmacist, or health care provider. °© 2015, Elsevier/Gold Standard. (2008-05-03 10:26:02) ° °

## 2014-10-13 NOTE — Telephone Encounter (Signed)
Noted  

## 2014-10-13 NOTE — Progress Notes (Addendum)
At the last increase of Remicade pt complains of simple itching, no whelps.  Decided to not increase the rate maintained the rate at 150hr. Will continue to moniter any changes in condition. 1215 Pt tolerated the 128ml/hr with no further complaints of itching. No respiratory changes, vital signs remained stable the entire infusion.

## 2014-10-25 ENCOUNTER — Encounter: Payer: Self-pay | Admitting: Gastroenterology

## 2014-11-22 ENCOUNTER — Encounter: Payer: Self-pay | Admitting: Gastroenterology

## 2014-11-22 ENCOUNTER — Ambulatory Visit (INDEPENDENT_AMBULATORY_CARE_PROVIDER_SITE_OTHER): Payer: No Typology Code available for payment source | Admitting: Gastroenterology

## 2014-11-22 VITALS — BP 121/69 | HR 70 | Temp 98.3°F | Ht 63.0 in | Wt 165.2 lb

## 2014-11-22 DIAGNOSIS — K50813 Crohn's disease of both small and large intestine with fistula: Secondary | ICD-10-CM

## 2014-11-22 NOTE — Progress Notes (Addendum)
Subjective:    Patient ID: Alexandria Garcia, female    DOB: 1986/01/31, 29 y.o.   MRN: [id number]  Gar Ponto, MD  HPI ONLY MANIFESTATION OF CROHN'S IS Bx ILEUM AND FISTULA BETWEEN VAGINA AND ANUS.Marland Kitchen HAD SX OF FISTULA SINCE AGE 83. DX: CROHN'S AGE 29. TRIED AND FAILED IMURAN FOR 6-7 MOS. FAILED HUMIRA AFTER TRYING FOR ONE YEAR. BMs: 1-2/DAY(NL, SOFT)  NO SORES IN MOUTH, RASH ON LEGS, JOINT PAIN, OR BACK PAIN. Mild pain in joints: knees-off and for > 1 yr-NOT NEW, NOT WORSE. NO DIARRHEA BUT CAN HAVE LOOSE STOOLS. STARTED REMICADE JUL 10,15, AUG 24, Jun 02, 2014. NEXT INFUSION MAR 11. MAY HAVE SOB/CAN'T GET A DEEP BREATH AUG 2015. NOW ON ADVAIR AND IT HELPS SOME. NO PULMONARY VISIT. RARE ABDOMINAL PAIN. LAST TCS 2013. TRIED HUMIRA FOR 1 YEAR. NO IBD SPECIALIST VISIT. LAST VISIT JAN 15-AFTER REMICADE AND PREMEDS-GOT RASH ON HER FEET AND CHEST AND RATE HELD BUT FINISHED INFUSION.  FISTULA SWELLS AND GETS WORSE 1 WEEK BEFORE/1 WEEK AFTER HER CYCLE. PENDING APPT AT [facility] Jan 08, 2014. PT NOT ACTIVELY TRYING TO GET PREGNANT , BUT NOT ON OCPs.  PT DENIES FEVER, CHILLS, HEMATOCHEZIA, HEMATEMESIS, nausea, vomiting, melena, diarrhea, CHEST PAIN,CHANGE IN BOWEL IN HABITS, constipation, problems swallowing, OR heartburn or indigestion.   Past Medical History  Diagnosis Date  . 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 Pilonidal cyst excision      X 3  . Bartholin gland cyst excision      X 2  . Colonoscopy  02/2012    Dr. Juanita Craver: Terminal ileum had multiple ulcers with pathology consistent with chronic active ileitis suggestive of Crohn's disease. Patchy erythema and scarring in the rectum with pus possibly from colonic Crohn's and fistula, rectal bx unremarkable.   No Known Allergies  Current Outpatient Prescriptions  Medication Sig Dispense Refill  . Fluticasone-Salmeterol (ADVAIR) 100-50  MCG/DOSE AEPB Inhale 1 puff into the lungs 2 (two) times daily.    Marland Kitchen inFLIXimab in sodium chloride 0.9 % Inject into the vein.    . Multiple Vitamin (MULTIVITAMIN) capsule Take 1 capsule by mouth daily.     Family History  Problem Relation Age of Onset  . Inflammatory bowel disease Neg Hx   . Celiac disease Neg Hx   . Colon cancer Neg Hx    Review of Systems PER HPI OTHERWISE ALL SYSTEMS ARE NEGATIVE.     Objective:   Physical Exam  Constitutional: She is oriented to person, place, and time. She appears well-developed and well-nourished. No distress.  HENT:  Head: Normocephalic and atraumatic.  Mouth/Throat: Oropharynx is clear and moist. No oropharyngeal exudate.  Eyes: Pupils are equal, round, and reactive to light. No scleral icterus.  Neck: Normal range of motion. Neck supple.  Cardiovascular: Normal rate, regular rhythm and normal heart sounds.   Pulmonary/Chest: Effort normal and breath sounds normal. No respiratory distress.  Abdominal: Soft. Bowel sounds are normal. She exhibits no distension. There is no tenderness.  Musculoskeletal: She exhibits no edema.  Lymphadenopathy:    She has no cervical adenopathy.  Neurological: She is alert and oriented to person, place, and time.  Skin: No rash noted.  Psychiatric:  SLIGHTLY ANXIOUS MOOD, flat AFFECT, TEARS UP WHEN TALKING ABOUT DIFFICULTY MANAGING/LIVING WITH HER DISEASE  Vitals reviewed.         Assessment & Plan:

## 2014-11-22 NOTE — Progress Notes (Signed)
ON RECALL LIST  °

## 2014-11-22 NOTE — Assessment & Plan Note (Addendum)
SX NOT IDEALLY CONTROLLED.  LAST INFUSION REMICADE-RASH/ITCHY FEET.  NEXT INFUSION REMICADE MAR 11 AT SLOWER RATE. INFUSION CONTACTED AND VO GIVEN. CONTINUE PREMEDS. .IF RASH/PRURITUS DEVELOP, WILL NEED TO CHANGE TO ENTYVIO. MED SIDE EFFECTS DISCUSSED TO INCLUDE PML. CONSIDER PALEO DIET. SEE IBD SPECIALIST AT [facility]. FOLLOW UP IN 2 MOS.

## 2014-11-22 NOTE — Patient Instructions (Addendum)
YOUR NEXT INFUSION REMICADE MAR 11 AT SLOWER RATE. CALL ME IF RASH/PRURITUS DEVELOPS. IF SO WE WILL NEED TO CHANGE TO ENTYVIO. ENTYVIO IS CLASS B. CLASS B MEANS Rating B MEANS-Either animal-reproduction studies have not demonstrated a fetal risk but there are no controlled studies in pregnant women or animal-reproduction studies have shown adverse effect (other than a decrease in fertility) that was not confirmed in controlled studies in women in the first trimester (and there is no evidence of a risk in later trimesters).   CONSIDER PALEO DIET.  SEE IBD SPECIALIST AT [facility].  FOLLOW UP IN 2 MOS.   PLEASE CALL WITH QUESTIONS OR CONCERNS.

## 2014-11-28 NOTE — Progress Notes (Signed)
CC'ED TO PCP 

## 2014-12-04 ENCOUNTER — Telehealth: Payer: Self-pay

## 2014-12-04 NOTE — Telephone Encounter (Signed)
[facility] pharmacy called to confirm delivery of the patients remicade. They will deliver it to the [facility] on Wed 12/06/14. They have that address in their system.

## 2014-12-08 ENCOUNTER — Encounter: Payer: Self-pay | Admitting: Gastroenterology

## 2014-12-08 ENCOUNTER — Telehealth: Payer: Self-pay | Admitting: Gastroenterology

## 2014-12-08 ENCOUNTER — Encounter (HOSPITAL_COMMUNITY)
Admission: RE | Admit: 2014-12-08 | Discharge: 2014-12-08 | Disposition: A | Discharge status: Home / Self Care | Payer: No Typology Code available for payment source | Source: Ambulatory Visit | Attending: Gastroenterology | Admitting: Gastroenterology

## 2014-12-08 DIAGNOSIS — K50813 Crohn's disease of both small and large intestine with fistula: Secondary | ICD-10-CM | POA: Diagnosis not present

## 2014-12-08 MED ORDER — SODIUM CHLORIDE 0.9 % IV SOLN
INTRAVENOUS | Status: DC
  Administered 2014-12-08: 250 mL via INTRAVENOUS

## 2014-12-08 MED ORDER — LORATADINE 10 MG PO TABS
10.0000 mg | ORAL_TABLET | Freq: Every day | ORAL | Status: DC
  Administered 2014-12-08: 10 mg via ORAL

## 2014-12-08 MED ORDER — METHYLPREDNISOLONE SODIUM SUCC 125 MG IJ SOLR
40.0000 mg | Freq: Once | INTRAMUSCULAR | Status: AC
  Administered 2014-12-08: 40 mg via INTRAVENOUS

## 2014-12-08 MED ORDER — ACETAMINOPHEN 325 MG PO TABS
ORAL_TABLET | ORAL | Status: AC
  Filled 2014-12-08: qty 2

## 2014-12-08 MED ORDER — DIPHENHYDRAMINE HCL 50 MG/ML IJ SOLN
25.0000 mg | Freq: Once | INTRAMUSCULAR | Status: AC
  Administered 2014-12-08: 25 mg via INTRAVENOUS

## 2014-12-08 MED ORDER — DIPHENHYDRAMINE HCL 50 MG/ML IJ SOLN
INTRAMUSCULAR | Status: AC
  Filled 2014-12-08: qty 1

## 2014-12-08 MED ORDER — FENTANYL CITRATE 0.05 MG/ML IJ SOLN
25.0000 ug | Freq: Once | INTRAMUSCULAR | Status: AC
  Administered 2014-12-08: 12.5 ug via INTRAVENOUS

## 2014-12-08 MED ORDER — ACETAMINOPHEN 325 MG PO TABS
650.0000 mg | ORAL_TABLET | Freq: Once | ORAL | Status: AC
  Administered 2014-12-08: 650 mg via ORAL

## 2014-12-08 MED ORDER — FENTANYL CITRATE 0.05 MG/ML IJ SOLN
INTRAMUSCULAR | Status: AC
  Filled 2014-12-08: qty 2

## 2014-12-08 MED ORDER — LORATADINE 10 MG PO TABS
ORAL_TABLET | ORAL | Status: AC
  Filled 2014-12-08: qty 1

## 2014-12-08 MED ORDER — METHYLPREDNISOLONE SODIUM SUCC 125 MG IJ SOLR
INTRAMUSCULAR | Status: AC
  Filled 2014-12-08: qty 2

## 2014-12-08 MED ORDER — SODIUM CHLORIDE 0.9 % IV SOLN
400.0000 mg | INTRAVENOUS | Status: DC
  Administered 2014-12-08: 400 mg via INTRAVENOUS
  Filled 2014-12-08 (×2): qty 40

## 2014-12-08 NOTE — Progress Notes (Signed)
REVIEWED-NO ADDITIONAL RECOMMENDATIONS. 

## 2014-12-08 NOTE — Progress Notes (Signed)
Dr Oneida Alar notified of pt status. "I feel funny." No further orders given at this time.

## 2014-12-08 NOTE — Progress Notes (Signed)
Awake. Resting quietly. Continues c/o abd cramping. Not rubbing abd. Watching TV. States needs to go to [facility]. Refuses to walk, wants wheelchair to go to [facility]. To [facility] via w/c.

## 2014-12-08 NOTE — Progress Notes (Signed)
Dr Oneida Alar called. Pt condition discussed. No new orders given.

## 2014-12-08 NOTE — Telephone Encounter (Signed)
PT DISCHARGED TO HOME IN STABLE CONDITION.

## 2014-12-08 NOTE — Progress Notes (Signed)
Awake. States "I feel funny. My heart is pounding." Denies difficulty breathing. No shortness of breath.

## 2014-12-08 NOTE — Progress Notes (Signed)
From [facility] via w/c. Voided without difficulty. BM x1. Continues c/o abd cramping. States "It feels better if I stand up." Standing beside recliner. Refuses pain med. Mother at side.

## 2014-12-08 NOTE — Progress Notes (Signed)
Awake. C/O itching and redness at IV site. Red areas with 2 small hives on right and left arms. Red rash to abd and without hives. Denies shortness of breath and difficulty breathing. remicade stopped. New bag of saline and IV tubing hung.

## 2014-12-08 NOTE — Progress Notes (Signed)
Dr Oneida Alar beeped.

## 2014-12-08 NOTE — Progress Notes (Signed)
Pt notified of fentanyl order. Refuses pain med at this time. "I do not want to take another new drug right now." Reassurance given.

## 2014-12-08 NOTE — Progress Notes (Signed)
Dr Oneida Alar notified of c/o abd pain and pt status. Order given for fentanyl.

## 2014-12-08 NOTE — Progress Notes (Signed)
Benadryl 25mg  IV slow given for rash and hives. Denies shortness of breath and difficulty breathing.

## 2014-12-08 NOTE — Progress Notes (Signed)
States "I am not itching any more." Redness and hives has decreased. Continues c/o abd cramping.

## 2014-12-08 NOTE — Progress Notes (Signed)
Dr Oneida Alar called. Pt condition discussed.order for benadryl given. Pt informed. Voiced understanding. Mother remains at bedside.

## 2014-12-08 NOTE — Progress Notes (Signed)
Awake. Denies pain. No rash/hives present. Wants to go home. D/C to home in good condition. Husband and mother at side. Ambulatory.

## 2014-12-08 NOTE — Progress Notes (Signed)
Here for remicade infusion. Dx Crohn's disease.

## 2014-12-08 NOTE — Progress Notes (Signed)
Awake. C/O abd cramping. Sitting in recliner rubbing lower abd. Rocking back and forth. Denies nausea. Denies need to go to BR. No shortness of breath. Red rash and hives has decreased. Mother remains at side.

## 2014-12-08 NOTE — Progress Notes (Addendum)
Fentanyl 12.5 mcg IV given for c/o abd cramping. Tolerated well. Mother called pt's husband. Husband at side.

## 2014-12-08 NOTE — Telephone Encounter (Signed)
CALLED BY RN. PT WITH REACTION TO REMICADE AFTER PREMEDS. SOLUMEDROL 40 MG IV, CLARITIN 10 MG PO, AND TYLENOL 650 MG PO. ORDER FOR BENADRYL 25 MG IV GIVEN. INFUSION STOPPED. REMICADE LISTED AS AN ALLERGY. WILL NEED ENTYVIO.  PHARMACY REQUEST SENT-ENTYVIO WK 0,2,6 THEN Q8 WEEKS.

## 2014-12-08 NOTE — Progress Notes (Signed)
Awake. Resting quietly. Denies pain. States "I am not hurting at all." No redness or hives present.

## 2014-12-11 NOTE — Progress Notes (Signed)
Fentanyl 12.5 mcg IV given. Documented in pyxis fentanyl 75 mcg wasted. Needs to be, fentanyl 87.5 mcg wasted not 75 mcg.

## 2014-12-19 ENCOUNTER — Encounter: Payer: Self-pay | Admitting: Gastroenterology

## 2015-02-08 ENCOUNTER — Telehealth: Payer: Self-pay | Admitting: General Practice

## 2015-02-08 ENCOUNTER — Encounter: Payer: Self-pay | Admitting: Gastroenterology

## 2015-02-08 MED ORDER — AMOXICILLIN-POT CLAVULANATE 500-125 MG PO TABS: ORAL_TABLET | ORAL | Status: DC

## 2015-02-08 NOTE — Addendum Note (Signed)
Addended by: Danie Binder on: 02/08/2015 12:43 PM   Modules accepted: Orders, Medications

## 2015-02-08 NOTE — Telephone Encounter (Signed)
Patient called in stating she may have another fistula and is experincing a lot of rectal pain.    She denies having any fever, abdominal pain, rectal bleeding or nausea/vomiting.    She saw a IBD specialist at [facility], Dr Carlos Levering and he started her on Imuran 50 mg once a day for two weeks.    Her blood work came back normal, so he would like for her to start the Imuran 150 mg once a day this week.    She is also getting ready to start Cimzia today.  She wanted to know if she needs to make an appt.  Routing to Dr. Oneida Alar

## 2015-02-08 NOTE — Telephone Encounter (Signed)
APPOINTMENT MADE AND LETTER MAILED  °

## 2015-02-08 NOTE — Telephone Encounter (Addendum)
PLEASE CALL PT. SHE SHOULD CALL [facility] TO DISCUSS HER SYMPTOMS. I THINK SHE NEEDS Sitzbaths 44min 2-3x per day AND A Rx FOR AUGMENTIN 500 MG BID FOR 10 DAYS WAS SENT TO HER PAHARMACY. OPV  E30 SLF IN 6 WEEKS.

## 2015-02-08 NOTE — Telephone Encounter (Signed)
Pt is aware. Routing to [place] to schedule appt in 6 weeks.

## 2015-03-28 ENCOUNTER — Ambulatory Visit: Payer: No Typology Code available for payment source | Admitting: Gastroenterology

## 2018-12-30 ENCOUNTER — Encounter (HOSPITAL_BASED_OUTPATIENT_CLINIC_OR_DEPARTMENT_OTHER): Payer: Self-pay

## 2018-12-30 ENCOUNTER — Ambulatory Visit (HOSPITAL_BASED_OUTPATIENT_CLINIC_OR_DEPARTMENT_OTHER): Admit: 2018-12-30 | Payer: No Typology Code available for payment source | Admitting: Obstetrics and Gynecology

## 2018-12-30 SURGERY — DILATATION & CURETTAGE/HYSTEROSCOPY WITH MYOSURE
Anesthesia: Choice

## 2019-02-24 NOTE — Patient Instructions (Signed)
Preslee Regas       Your procedure is scheduled on Thursday June 4,2020     Report to [place].M.    Call this number if you have problems the morning of [phone]   OUR ADDRESS IS [address], WE ARE LOCATED IN THE [facility]ICAL [facility] WITH [facility].   Remember:  Do not eat food  after midnight.  .          YOU MAY HAVE CLEAR LIQUIDS FROM MIDNIGHT UNTIL 4:30AM. At 4:30AM Please finish the prescribed Pre-Surgery insure drink.             Nothing by mouth after you finish the drink !   Take these medicines the morning of surgery with A SIP OF WATER  sertraline(Zoloft), azathioprine (Imuran)                 Please bring your inhaler to the hospital with you.   Do not wear jewelry, make-up or nail polish.  Do not wear lotions, powders, or perfumes, or deoderant.  Do not shave 48 hours prior to surgery.    Do not bring valuables to the hospital.  [facility] is not responsible for any belongings or valuables.  Contacts, dentures or bridgework may not be worn into surgery.  For patients admitted to the hospital, discharge time will be determined by your treatment team.  Patients discharged the day of surgery will not be allowed to drive home.   Special instructions:    Please read over the following fact sheets that you were given:    [facility] - Preparing for Surgery  Before surgery, you can play an important role.  Because skin is not sterile, your skin needs to be as free of germs as possible.   You can reduce the number of germs on your skin by washing with CHG (chlorahexidine gluconate) soap before surgery.   CHG is an antiseptic cleaner which kills germs and bonds with the skin to continue killing germs even after washing. Please DO NOT use if you have an allergy to CHG or antibacterial soaps.  If your skin becomes reddened/irritated stop using the CHG and inform your nurse when you arrive at Short Stay. Do  not shave (including legs and underarms) for at least 48 hours prior to the first CHG shower.  You may shave your face/neck . Please follow these instructions carefully :  1.  Shower with CHG Soap the night before surgery and the  morning of Surgery.  2.  If you choose to wash your hair, wash your hair first as usual with your  normal  shampoo.  3.  After you shampoo, rinse your hair and body thoroughly to remove the  shampoo.                                     4.   .  Use CHG as you would any other liquid soap.  You can apply chg directly  to the skin and wash                       Gently with a scrungie or clean washcloth.  5.  Apply the CHG Soap to your body ONLY FROM THE NECK DOWN.   Do not use on face/ open  Wound or open sores. Avoid contact with eyes, ears mouth and genitals (private parts).                       Wash face,  Genitals (private parts) with your normal soap.             6.  Wash thoroughly, paying special attention to the area where your surgery  will be performed.  7.  Thoroughly rinse your body with warm water from the neck down.  8.  DO NOT shower/wash with your normal soap after using and rinsing off  the CHG Soap.              9.  Pat yourself dry with a clean towel.            10.  Wear clean pajamas.            11.  Place clean sheets on your bed the night of your first shower and do not  sleep with pets.  Day of Surgery : Do not apply any lotions/deodorants the morning of surgery.  Please wear clean clothes to the hospital/surgery center.   FAILURE TO FOLLOW THESE INSTRUCTIONS MAY RESULT IN THE CANCELLATION OF YOUR SURGERY PATIENT SIGNATURE_________________________________  NURSE SIGNATURE__________________________________  ________________________________________________________________________   Adam Phenix  An incentive spirometer is a tool that can help keep your lungs clear and active. This tool measures how well  you are filling your lungs with each breath. Taking long deep breaths may help reverse or decrease the chance of developing breathing (pulmonary) problems (especially infection) following:  A long period of time when you are unable to move or be active. BEFORE THE PROCEDURE   If the spirometer includes an indicator to show your best effort, your nurse or respiratory therapist will set it to a desired goal.  If possible, sit up straight or lean slightly forward. Try not to slouch.  Hold the incentive spirometer in an upright position. INSTRUCTIONS FOR USE  1. Sit on the edge of your bed if possible, or sit up as far as you can in bed or on a chair. 2. Hold the incentive spirometer in an upright position. 3. Breathe out normally. 4. Place the mouthpiece in your mouth and seal your lips tightly around it. 5. Breathe in slowly and as deeply as possible, raising the piston or the ball toward the top of the column. 6. Hold your breath for 3-5 seconds or for as long as possible. Allow the piston or ball to fall to the bottom of the column. 7. Remove the mouthpiece from your mouth and breathe out normally. 8. Rest for a few seconds and repeat Steps 1 through 7 at least 10 times every 1-2 hours when you are awake. Take your time and take a few normal breaths between deep breaths. 9. The spirometer may include an indicator to show your best effort. Use the indicator as a goal to work toward during each repetition. 10. After each set of 10 deep breaths, practice coughing to be sure your lungs are clear. If you have an incision (the cut made at the time of surgery), support your incision when coughing by placing a pillow or rolled up towels firmly against it. Once you are able to get out of bed, walk around indoors and cough well. You may stop using the incentive spirometer when instructed by your caregiver.  RISKS AND COMPLICATIONS  Take your time so you do not get  dizzy or light-headed.  If you are  in pain, you may need to take or ask for pain medication before doing incentive spirometry. It is harder to take a deep breath if you are having pain. AFTER USE  Rest and breathe slowly and easily.  It can be helpful to keep track of a log of your progress. Your caregiver can provide you with a simple table to help with this. If you are using the spirometer at home, follow these instructions: [place] IF:   You are having difficultly using the spirometer.  You have trouble using the spirometer as often as instructed.  Your pain medication is not giving enough relief while using the spirometer.  You develop fever of 100.5 F (38.1 C) or higher. SEEK IMMEDIATE MEDICAL CARE IF:   You cough up bloody sputum that had not been present before.  You develop fever of 102 F (38.9 C) or greater.  You develop worsening pain at or near the incision site. MAKE SURE YOU:   Understand these instructions.  Will watch your condition.  Will get help right away if you are not doing well or get worse. Document Released: 01/26/2007 Document Revised: 12/08/2011 Document Reviewed: 03/29/2007 [facility] Patient Information 2014 [place], [place].   ________________________________________________________________________

## 2019-02-25 ENCOUNTER — Encounter (HOSPITAL_COMMUNITY): Payer: Self-pay

## 2019-02-25 ENCOUNTER — Other Ambulatory Visit: Payer: Self-pay

## 2019-02-25 ENCOUNTER — Encounter (HOSPITAL_COMMUNITY)
Admission: RE | Admit: 2019-02-25 | Discharge: 2019-02-25 | Disposition: A | Discharge status: Home / Self Care | Payer: 59 | Source: Ambulatory Visit | Attending: Obstetrics and Gynecology | Admitting: Obstetrics and Gynecology

## 2019-02-25 DIAGNOSIS — Z01812 Encounter for preprocedural laboratory examination: Secondary | ICD-10-CM | POA: Insufficient documentation

## 2019-02-25 DIAGNOSIS — N920 Excessive and frequent menstruation with regular cycle: Secondary | ICD-10-CM | POA: Insufficient documentation

## 2019-02-25 DIAGNOSIS — Z1159 Encounter for screening for other viral diseases: Secondary | ICD-10-CM | POA: Insufficient documentation

## 2019-02-25 DIAGNOSIS — D250 Submucous leiomyoma of uterus: Secondary | ICD-10-CM | POA: Insufficient documentation

## 2019-02-25 HISTORY — DX: Unspecified asthma, uncomplicated: J45.909

## 2019-02-25 HISTORY — DX: Anxiety disorder, unspecified: F41.9

## 2019-02-25 LAB — CBC
HCT: 37.2 % (ref 36.0–46.0)
Hemoglobin: 11.8 g/dL — ABNORMAL LOW (ref 12.0–15.0)
MCH: 29.1 pg (ref 26.0–34.0)
MCHC: 31.7 g/dL (ref 30.0–36.0)
MCV: 91.9 fL (ref 80.0–100.0)
Platelets: 411 10*3/uL — ABNORMAL HIGH (ref 150–400)
RBC: 4.05 MIL/uL (ref 3.87–5.11)
RDW: 13.6 % (ref 11.5–15.5)
WBC: 5.9 10*3/uL (ref 4.0–10.5)
nRBC: 0 % (ref 0.0–0.2)

## 2019-02-25 LAB — ABO/RH: ABO/RH(D): O POS

## 2019-02-28 ENCOUNTER — Other Ambulatory Visit (HOSPITAL_COMMUNITY)
Admission: RE | Admit: 2019-02-28 | Discharge: 2019-02-28 | Disposition: A | Discharge status: Home / Self Care | Payer: 59 | Source: Ambulatory Visit | Attending: Obstetrics and Gynecology | Admitting: Obstetrics and Gynecology

## 2019-02-28 ENCOUNTER — Other Ambulatory Visit: Payer: Self-pay

## 2019-02-28 DIAGNOSIS — Z01812 Encounter for preprocedural laboratory examination: Secondary | ICD-10-CM | POA: Diagnosis not present

## 2019-03-02 LAB — NOVEL CORONAVIRUS, NAA (HOSP ORDER, SEND-OUT TO REF LAB; TAT 18-24 HRS): SARS-CoV-2, NAA: NOT DETECTED

## 2019-03-02 NOTE — H&P (Signed)
Alexandria Garcia is an 33 y.o. female G52 presents for hysteroscopy/resection of submucosal fibroid measuring  2.2cm with Myosure.   Her periods having been longer and heavier over the year. She tried two different OCPs tried for control but had sxs of sleep disturbances and longer bleeding with both pills brands, now wants surgical therapy.  Pertinent Gynecological History: fibroids   Menstrual History:  Patient's last menstrual period was 02/07/2019 (approximate).    Past Medical History:  Diagnosis Date  . Anxiety   . Asthma   . Crohn's disease of both small and large intestine with fistula ([place])    Diagnosed 2013    Past Surgical History:  Procedure Laterality Date  . BARTHOLIN GLAND CYST EXCISION     X 2  . COLONOSCOPY  02/2012 [facility]   Terminal ILEITIS-Crohn's disease/COLITIS/FISTULA  . PILONIDAL CYST EXCISION     X 3    Family History  Problem Relation Age of Onset  . Inflammatory bowel disease Neg Hx   . Celiac disease Neg Hx   . Colon cancer Neg Hx     Social History:  reports that she has never smoked. She has never used smokeless tobacco. She reports current alcohol use. She reports that she does not use drugs.  Allergies:  Allergies  Allergen Reactions  . Remicade [Infliximab] Other (See Comments)    RASH ON FACE AND ARMS AFTER 50 CC INFUSION    No medications prior to admission.    Review of Systems  Constitutional: Negative for fever.  Cardiovascular: Negative for chest pain.  Gastrointestinal: Negative for abdominal pain.  Neurological: Negative for headaches.    Last menstrual period 02/07/2019. Physical Exam  Constitutional: She appears well-developed.  Cardiovascular: Normal rate and regular rhythm.  Respiratory: Effort normal.  GI: Soft.  Genitourinary:    Vagina normal.   Neurological: She is alert.  Psychiatric: She has a normal mood and affect.    No results found for this or any previous visit (from the past 24 hour(s)).  No results  found.  Assessment/Plan: The patient was counseled regarding the hysteroscopy procedure in detail. We reviewed risks of bleeding and infection and possible uterine perforation. We also discussed the removal of any identified polyps or submucosal fibroids and what that would involve. The patient desires to proceed. She will use cytotec 452mcg 3 hours prior to the procedure to aid with cervical dilation.  Logan Bores 03/02/2019, 9:27 PM

## 2019-03-02 NOTE — Progress Notes (Signed)
SPOKE W/  _ Devony    SCREENING SYMPTOMS OF COVID 19:   COUGH--NO  RUNNY NOSE---NO   SORE THROAT---NO  NASAL CONGESTION----NO  SNEEZING----NO  SHORTNESS OF BREATH---NO  DIFFICULTY BREATHING---NO  TEMP >100.0 -----NO  UNEXPLAINED BODY ACHES------NO  CHILLS -------- NO  HEADACHES ---------NO  LOSS OF SMELL/ TASTE --------NO    HAVE YOU OR ANY FAMILY MEMBER TRAVELLED PAST 14 DAYS OUT OF THE   COUNTY---NO STATE----NO COUNTRY----NO  HAVE YOU OR ANY FAMILY MEMBER BEEN EXPOSED TO ANYONE WITH COVID 19? NO

## 2019-03-03 ENCOUNTER — Other Ambulatory Visit: Payer: Self-pay

## 2019-03-03 ENCOUNTER — Ambulatory Visit (HOSPITAL_BASED_OUTPATIENT_CLINIC_OR_DEPARTMENT_OTHER): Payer: 59 | Admitting: Physician Assistant

## 2019-03-03 ENCOUNTER — Encounter (HOSPITAL_BASED_OUTPATIENT_CLINIC_OR_DEPARTMENT_OTHER)
Admission: RE | Disposition: A | Discharge status: Home / Self Care | Payer: Self-pay | Source: Home / Self Care | Attending: Obstetrics and Gynecology

## 2019-03-03 ENCOUNTER — Ambulatory Visit (HOSPITAL_BASED_OUTPATIENT_CLINIC_OR_DEPARTMENT_OTHER)
Admission: RE | Admit: 2019-03-03 | Discharge: 2019-03-03 | Disposition: A | Discharge status: Home / Self Care | Payer: 59 | Attending: Obstetrics and Gynecology | Admitting: Obstetrics and Gynecology

## 2019-03-03 ENCOUNTER — Encounter (HOSPITAL_BASED_OUTPATIENT_CLINIC_OR_DEPARTMENT_OTHER): Payer: Self-pay | Admitting: *Deleted

## 2019-03-03 ENCOUNTER — Ambulatory Visit (HOSPITAL_BASED_OUTPATIENT_CLINIC_OR_DEPARTMENT_OTHER): Payer: 59 | Admitting: Anesthesiology

## 2019-03-03 DIAGNOSIS — D250 Submucous leiomyoma of uterus: Secondary | ICD-10-CM

## 2019-03-03 DIAGNOSIS — J45909 Unspecified asthma, uncomplicated: Secondary | ICD-10-CM | POA: Diagnosis not present

## 2019-03-03 DIAGNOSIS — N920 Excessive and frequent menstruation with regular cycle: Secondary | ICD-10-CM | POA: Diagnosis not present

## 2019-03-03 DIAGNOSIS — F419 Anxiety disorder, unspecified: Secondary | ICD-10-CM | POA: Diagnosis not present

## 2019-03-03 DIAGNOSIS — Z79899 Other long term (current) drug therapy: Secondary | ICD-10-CM | POA: Diagnosis not present

## 2019-03-03 HISTORY — PX: MYOMECTOMY: SHX85

## 2019-03-03 HISTORY — PX: DILATATION & CURETTAGE/HYSTEROSCOPY WITH MYOSURE: SHX6511

## 2019-03-03 LAB — TYPE AND SCREEN
ABO/RH(D): O POS
Antibody Screen: NEGATIVE

## 2019-03-03 LAB — POCT PREGNANCY, URINE: Preg Test, Ur: NEGATIVE

## 2019-03-03 SURGERY — DILATATION & CURETTAGE/HYSTEROSCOPY WITH MYOSURE
Anesthesia: General | Site: Vagina

## 2019-03-03 MED ORDER — DEXAMETHASONE SODIUM PHOSPHATE 10 MG/ML IJ SOLN
INTRAMUSCULAR | Status: DC | PRN
  Administered 2019-03-03: 10 mg via INTRAVENOUS

## 2019-03-03 MED ORDER — HYDROMORPHONE HCL 1 MG/ML IJ SOLN
0.2500 mg | INTRAMUSCULAR | Status: DC | PRN
  Administered 2019-03-03 (×3): 0.5 mg via INTRAVENOUS
  Filled 2019-03-03: qty 0.5

## 2019-03-03 MED ORDER — LACTATED RINGERS IV SOLN
INTRAVENOUS | Status: DC
  Administered 2019-03-03: 07:00:00 via INTRAVENOUS
  Filled 2019-03-03: qty 1000

## 2019-03-03 MED ORDER — MIDAZOLAM HCL 5 MG/5ML IJ SOLN
INTRAMUSCULAR | Status: DC | PRN
  Administered 2019-03-03: 2 mg via INTRAVENOUS

## 2019-03-03 MED ORDER — SODIUM CHLORIDE 0.9 % IR SOLN
Status: DC | PRN
  Administered 2019-03-03: 3000 mL
  Administered 2019-03-03: 1000 mL

## 2019-03-03 MED ORDER — ONDANSETRON HCL 4 MG/2ML IJ SOLN
INTRAMUSCULAR | Status: DC | PRN
  Administered 2019-03-03: 4 mg via INTRAVENOUS

## 2019-03-03 MED ORDER — SUCCINYLCHOLINE CHLORIDE 200 MG/10ML IV SOSY
PREFILLED_SYRINGE | INTRAVENOUS | Status: DC | PRN
  Administered 2019-03-03: 140 mg via INTRAVENOUS

## 2019-03-03 MED ORDER — IBUPROFEN 200 MG PO TABS: 600.0000 mg | ORAL_TABLET | Freq: Four times a day (QID) | ORAL | 0 refills | Status: DC | PRN

## 2019-03-03 MED ORDER — LIDOCAINE HCL 1 % IJ SOLN
INTRAMUSCULAR | Status: DC | PRN
  Administered 2019-03-03: 20 mL

## 2019-03-03 MED ORDER — PROPOFOL 10 MG/ML IV BOLUS
INTRAVENOUS | Status: DC | PRN
  Administered 2019-03-03: 140 mg via INTRAVENOUS

## 2019-03-03 MED ORDER — MIDAZOLAM HCL 2 MG/2ML IJ SOLN
INTRAMUSCULAR | Status: AC
  Filled 2019-03-03: qty 2

## 2019-03-03 MED ORDER — LIDOCAINE 2% (20 MG/ML) 5 ML SYRINGE
INTRAMUSCULAR | Status: AC
  Filled 2019-03-03: qty 5

## 2019-03-03 MED ORDER — LIDOCAINE 2% (20 MG/ML) 5 ML SYRINGE
INTRAMUSCULAR | Status: DC | PRN
  Administered 2019-03-03: 100 mg via INTRAVENOUS

## 2019-03-03 MED ORDER — ONDANSETRON HCL 4 MG/2ML IJ SOLN
INTRAMUSCULAR | Status: AC
  Filled 2019-03-03: qty 2

## 2019-03-03 MED ORDER — FENTANYL CITRATE (PF) 100 MCG/2ML IJ SOLN
INTRAMUSCULAR | Status: AC
  Filled 2019-03-03: qty 2

## 2019-03-03 MED ORDER — SILVER NITRATE-POT NITRATE 75-25 % EX MISC
CUTANEOUS | Status: DC | PRN
  Administered 2019-03-03: 2

## 2019-03-03 MED ORDER — MEPERIDINE HCL 25 MG/ML IJ SOLN
6.2500 mg | INTRAMUSCULAR | Status: DC | PRN
  Filled 2019-03-03: qty 1

## 2019-03-03 MED ORDER — PROPOFOL 10 MG/ML IV BOLUS
INTRAVENOUS | Status: AC
  Filled 2019-03-03: qty 20

## 2019-03-03 MED ORDER — SUCCINYLCHOLINE CHLORIDE 200 MG/10ML IV SOSY
PREFILLED_SYRINGE | INTRAVENOUS | Status: AC
  Filled 2019-03-03: qty 10

## 2019-03-03 MED ORDER — HYDROMORPHONE HCL 1 MG/ML IJ SOLN
INTRAMUSCULAR | Status: AC
  Filled 2019-03-03: qty 1

## 2019-03-03 MED ORDER — FENTANYL CITRATE (PF) 100 MCG/2ML IJ SOLN
INTRAMUSCULAR | Status: DC | PRN
  Administered 2019-03-03: 100 ug via INTRAVENOUS

## 2019-03-03 MED ORDER — DEXAMETHASONE SODIUM PHOSPHATE 10 MG/ML IJ SOLN
INTRAMUSCULAR | Status: AC
  Filled 2019-03-03: qty 1

## 2019-03-03 MED ORDER — ONDANSETRON HCL 4 MG/2ML IJ SOLN
4.0000 mg | Freq: Once | INTRAMUSCULAR | Status: DC | PRN
  Filled 2019-03-03: qty 2

## 2019-03-03 SURGICAL SUPPLY — 24 items
CANISTER SUCT 3000ML PPV (MISCELLANEOUS) ×4 IMPLANT
CATH ROBINSON RED A/P 16FR (CATHETERS) ×3 IMPLANT
COVER WAND RF STERILE (DRAPES) ×3 IMPLANT
DEVICE MYOSURE LITE (MISCELLANEOUS) IMPLANT
DEVICE MYOSURE REACH (MISCELLANEOUS) ×3 IMPLANT
GAUZE 4X4 16PLY RFD (DISPOSABLE) ×3 IMPLANT
GLOVE BIO SURGEON STRL SZ 6.5 (GLOVE) ×1 IMPLANT
GLOVE BIO SURGEON STRL SZ7 (GLOVE) ×4 IMPLANT
GLOVE BIOGEL PI IND STRL 6.5 (GLOVE) IMPLANT
GLOVE BIOGEL PI INDICATOR 6.5 (GLOVE) ×1
GOWN STRL REUS W/ TWL LRG LVL3 (GOWN DISPOSABLE) ×2 IMPLANT
GOWN STRL REUS W/TWL LRG LVL3 (GOWN DISPOSABLE) ×6
IV NS IRRIG 3000ML ARTHROMATIC (IV SOLUTION) ×3 IMPLANT
KIT PROCEDURE FLUENT (KITS) ×3 IMPLANT
KIT TURNOVER CYSTO (KITS) ×3 IMPLANT
MYOSURE XL FIBROID (MISCELLANEOUS)
NDL SPNL 22GX3.5 QUINCKE BK (NEEDLE) ×2 IMPLANT
NEEDLE SPNL 22GX3.5 QUINCKE BK (NEEDLE) ×3 IMPLANT
PACK VAGINAL MINOR WOMEN LF (CUSTOM PROCEDURE TRAY) ×3 IMPLANT
PAD OB MATERNITY 4.3X12.25 (PERSONAL CARE ITEMS) ×3 IMPLANT
SEAL CERVICAL OMNI LOK (ABLATOR) IMPLANT
SEAL ROD LENS SCOPE MYOSURE (ABLATOR) ×3 IMPLANT
SYSTEM TISS REMOVAL MYOSURE XL (MISCELLANEOUS) IMPLANT
TOWEL OR 17X26 10 PK STRL BLUE (TOWEL DISPOSABLE) ×5 IMPLANT

## 2019-03-03 NOTE — Op Note (Signed)
Operative Note    Preoperative Diagnosis Menorrhagia Submucosal fibroid  Postoperative Diagnosis same  Procedure Hysteroscopy with Resection of submucosal fibroid x 2  Surgeon Paula Compton, MD   Fluids: EBL 27mL UOP 152mL prior to procedure IVF 1259mL Hysteroscopic deficit 439mL  Findings A 2.5cm fibroid on right posterior wall off fundus and small 0.5 cm fibroid in upper right cornua.  Tubal ostia appear normal and remainder of cavity WNL  Specimen Fibroid fragments and endometria currettings  Procedure Note Patient was taken to the operating room where LMA anesthesia was obtained without difficulty. She was then prepped and draped in the normal sterile fashion in the dorsal lithotomy position. An appropriate time out was performed. A speculum was then placed within the vagina and the anterior lip of the cervix identified and injected with approximately 2 cc of 1% plain lidocaine. An additional 9 cc each was placed at 2 and 10:00 for a paracervical block. Uterus was then sounded to approximately 8-9 cm and the [facility] dilators utilized to dilate the cervix up to approximately 21. The Myosure operating scope was then introduced into the cervix and the cavity inspected with findings as previously noted. The Myosure REACH operating blade was then introduced through the scope and under direct visualization the fibroids were removed in entirety. There was no active bleeding at the conclusion of the removal. The operating scope was then removed from the cervix a small curette inserted into the uterine fundus. A gentle curettage was performed in all 4 quadrants to sample any remaining endometrium. All tissue specimens were handed off to pathology. The tenaculum was then removed from the cervix and the site rendered hemostatic with silver nitrate. Finally the speculum was removed from the vagina and the patient awakened and taken to the recovery room in good condition.

## 2019-03-03 NOTE — Transfer of Care (Signed)
Immediate Anesthesia Transfer of Care Note  Patient: Alexandria Garcia  Procedure(s) Performed: DILATATION & CURETTAGE/HYSTEROSCOPY WITH MYOSURE (N/A Uterus) VAGINAL MYOMECTOMY (N/A Vagina )  Patient Location: PACU  Anesthesia Type:General  Level of Consciousness: awake, alert  and oriented  Airway & Oxygen Therapy: Patient Spontanous Breathing and Patient connected to nasal cannula oxygen  Post-op Assessment: Report given to RN and Post -op Vital signs reviewed and stable  Post vital signs: Reviewed and stable  Last Vitals:  Vitals Value Taken Time  BP    Temp    Pulse    Resp    SpO2      Last Pain:  Vitals:   03/03/19 0707  TempSrc:   PainSc: 4       Patients Stated Pain Goal: 7 (37/34/28 7681)  Complications: No apparent anesthesia complications

## 2019-03-03 NOTE — Discharge Instructions (Signed)
Post Anesthesia Home Care Instructions  Activity: Get plenty of rest for the remainder of the day. A responsible adult should stay with you for 24 hours following the procedure.  For the next 24 hours, DO NOT: -Drive a car -Paediatric nurse -Drink alcoholic beverages -Take any medication unless instructed by your physician -Make any legal decisions or sign important papers.  Meals: Start with liquid foods such as gelatin or soup. Progress to regular foods as tolerated. Avoid greasy, spicy, heavy foods. If nausea and/or vomiting occur, drink only clear liquids until the nausea and/or vomiting subsides. Call your physician if vomiting continues.  Special Instructions/Symptoms: Your throat may feel dry or sore from the anesthesia or the breathing tube placed in your throat during surgery. If this causes discomfort, gargle with warm salt water. The discomfort should disappear within 24 hours.  If you had a scopolamine patch placed behind your ear for the management of post- operative nausea and/or vomiting:  1. The medication in the patch is effective for 72 hours, after which it should be removed.  Wrap patch in a tissue and discard in the trash. Wash hands thoroughly with soap and water. 2. You may remove the patch earlier than 72 hours if you experience unpleasant side effects which may include dry mouth, dizziness or visual disturbances. 3. Avoid touching the patch. Wash your hands with soap and water after contact with the patch.   DISCHARGE INSTRUCTIONS: D&C / D&E The following instructions have been prepared to help you care for yourself upon your return home.   Personal hygiene:  Use sanitary pads for vaginal drainage, not tampons.  Shower the day after your procedure.  NO tub baths, pools or Jacuzzis for 2-3 weeks.  Wipe front to back after using the bathroom.  Activity and limitations:  Do NOT drive or operate any equipment for 24 hours. The effects of anesthesia are  still present and drowsiness may result.  Do NOT rest in bed all day.  Walking is encouraged.  Walk up and down stairs slowly.  You may resume your normal activity in one to two days or as indicated by your physician.  Sexual activity: NO intercourse for at least 2 weeks after the procedure, or as indicated by your physician.  Diet: Eat a light meal as desired this evening. You may resume your usual diet tomorrow.  Return to work: You may resume your work activities in one to two days or as indicated by your doctor.  What to expect after your surgery: Expect to have vaginal bleeding/discharge for 2-3 days and spotting for up to 10 days. It is not unusual to have soreness for up to 1-2 weeks. You may have a slight burning sensation when you urinate for the first day. Mild cramps may continue for a couple of days. You may have a regular period in 2-6 weeks.  Call your doctor for any of the following:  Excessive vaginal bleeding, saturating and changing one pad every hour.  Inability to urinate 6 hours after discharge from hospital.  Pain not relieved by pain medication.  Fever of 100.4 F or greater.  Unusual vaginal discharge or odor.   Call for an appointment:    Patients signature: ______________________  Nurses signature ________________________  Support person's signature_______________________   Regional Anesthesia Blocks  1. Numbness or the inability to move the "blocked" extremity may last from 3-48 hours after placement. The length of time depends on the medication injected and your individual response to the medication.  If the numbness is not going away after 48 hours, call your surgeon.  2. The extremity that is blocked will need to be protected until the numbness is gone and the  Strength has returned. Because you cannot feel it, you will need to take extra care to avoid injury. Because it may be weak, you may have difficulty moving it or using it. You may not  know what position it is in without looking at it while the block is in effect.  3. For blocks in the legs and feet, returning to weight bearing and walking needs to be done carefully. You will need to wait until the numbness is entirely gone and the strength has returned. You should be able to move your leg and foot normally before you try and bear weight or walk. You will need someone to be with you when you first try to ensure you do not fall and possibly risk injury.  4. Bruising and tenderness at the needle site are common side effects and will resolve in a few days.  5. Persistent numbness or new problems with movement should be communicated to the surgeon or the [place] [phone] [place] [phone]).

## 2019-03-03 NOTE — Progress Notes (Signed)
Patient ID: Alexandria Garcia, female   DOB: 1986/01/08, 33 y.o.   MRN: [id number] Per patient no changes in dictated H&P.  Brief exam WNL.  Reviewed procedure and ready to proceed.

## 2019-03-03 NOTE — Anesthesia Postprocedure Evaluation (Signed)
Anesthesia Post Note  Patient: Danitza Schoenfeldt  Procedure(s) Performed: DILATATION & CURETTAGE/HYSTEROSCOPY WITH MYOSURE (N/A Uterus) VAGINAL MYOMECTOMY (N/A Vagina )     Patient location during evaluation: PACU Anesthesia Type: General Level of consciousness: awake and alert Pain management: pain level controlled Vital Signs Assessment: post-procedure vital signs reviewed and stable Respiratory status: spontaneous breathing, nonlabored ventilation, respiratory function stable and patient connected to nasal cannula oxygen Cardiovascular status: blood pressure returned to baseline and stable Postop Assessment: no apparent nausea or vomiting Anesthetic complications: no    Last Vitals:  Vitals:   03/03/19 0950 03/03/19 1100  BP:  118/63  Pulse:    Resp:  13  Temp:  36.6 C  SpO2: 97% 96%    Last Pain:  Vitals:   03/03/19 1035  TempSrc:   PainSc: 3                  Rogue Pautler DAVID

## 2019-03-03 NOTE — Anesthesia Procedure Notes (Signed)
Procedure Name: Intubation Date/Time: 03/03/2019 8:35 AM Performed by: Onie Kasparek D, CRNA Pre-anesthesia Checklist: Patient identified, Emergency Drugs available, Suction available and Patient being monitored Patient Re-evaluated:Patient Re-evaluated prior to induction Oxygen Delivery Method: Circle system utilized Preoxygenation: Pre-oxygenation with 100% oxygen Induction Type: IV induction and Rapid sequence Laryngoscope Size: Mac and 3 Tube type: Oral Tube size: 7.5 mm Number of attempts: 1 Airway Equipment and Method: Stylet Placement Confirmation: ETT inserted through vocal cords under direct vision,  positive ETCO2 and breath sounds checked- equal and bilateral Secured at: 22 cm Tube secured with: Tape Dental Injury: Teeth and Oropharynx as per pre-operative assessment

## 2019-03-03 NOTE — Anesthesia Preprocedure Evaluation (Signed)
Anesthesia Evaluation  Patient identified by MRN, date of birth, ID band Patient awake    Reviewed: Allergy & Precautions, NPO status , Patient's Chart, lab work & pertinent test results  Airway Mallampati: I  TM Distance: >3 FB Neck ROM: Full    Dental   Pulmonary    Pulmonary exam normal        Cardiovascular Normal cardiovascular exam     Neuro/Psych Anxiety    GI/Hepatic H/O Crohns   Endo/Other    Renal/GU      Musculoskeletal   Abdominal   Peds  Hematology   Anesthesia Other Findings   Reproductive/Obstetrics                             Anesthesia Physical Anesthesia Plan  ASA: II  Anesthesia Plan: General   Post-op Pain Management:    Induction: Intravenous  PONV Risk Score and Plan: 3  Airway Management Planned: Oral ETT  Additional Equipment:   Intra-op Plan:   Post-operative Plan: Extubation in OR  Informed Consent: I have reviewed the patients History and Physical, chart, labs and discussed the procedure including the risks, benefits and alternatives for the proposed anesthesia with the patient or authorized representative who has indicated his/her understanding and acceptance.       Plan Discussed with: CRNA and Surgeon  Anesthesia Plan Comments:         Anesthesia Quick Evaluation

## 2019-03-04 ENCOUNTER — Encounter (HOSPITAL_BASED_OUTPATIENT_CLINIC_OR_DEPARTMENT_OTHER): Payer: Self-pay | Admitting: Obstetrics and Gynecology

## 2019-07-07 ENCOUNTER — Other Ambulatory Visit: Payer: Self-pay

## 2019-07-07 DIAGNOSIS — Z20822 Contact with and (suspected) exposure to covid-19: Secondary | ICD-10-CM

## 2019-07-09 LAB — NOVEL CORONAVIRUS, NAA

## 2020-02-14 LAB — OB RESULTS CONSOLE GC/CHLAMYDIA
Chlamydia: NEGATIVE
Gonorrhea: NEGATIVE

## 2020-02-14 LAB — OB RESULTS CONSOLE RPR: RPR: NONREACTIVE

## 2020-02-14 LAB — OB RESULTS CONSOLE HEPATITIS B SURFACE ANTIGEN: Hepatitis B Surface Ag: NEGATIVE

## 2020-02-14 LAB — OB RESULTS CONSOLE ABO/RH: RH Type: POSITIVE

## 2020-02-14 LAB — OB RESULTS CONSOLE HIV ANTIBODY (ROUTINE TESTING): HIV: NONREACTIVE

## 2020-02-14 LAB — OB RESULTS CONSOLE ANTIBODY SCREEN: Antibody Screen: NEGATIVE

## 2020-02-14 LAB — OB RESULTS CONSOLE RUBELLA ANTIBODY, IGM: Rubella: IMMUNE

## 2020-02-19 ENCOUNTER — Inpatient Hospital Stay (HOSPITAL_COMMUNITY): Payer: 59

## 2020-02-19 ENCOUNTER — Other Ambulatory Visit: Payer: Self-pay

## 2020-02-19 ENCOUNTER — Inpatient Hospital Stay (HOSPITAL_COMMUNITY)
Admission: AD | Admit: 2020-02-19 | Discharge: 2020-02-19 | Disposition: A | Discharge status: Home / Self Care | Payer: 59 | Attending: Obstetrics and Gynecology | Admitting: Obstetrics and Gynecology

## 2020-02-19 ENCOUNTER — Encounter (HOSPITAL_COMMUNITY): Payer: Self-pay | Admitting: Obstetrics and Gynecology

## 2020-02-19 DIAGNOSIS — O3411 Maternal care for benign tumor of corpus uteri, first trimester: Secondary | ICD-10-CM | POA: Diagnosis not present

## 2020-02-19 DIAGNOSIS — O99341 Other mental disorders complicating pregnancy, first trimester: Secondary | ICD-10-CM | POA: Insufficient documentation

## 2020-02-19 DIAGNOSIS — F419 Anxiety disorder, unspecified: Secondary | ICD-10-CM | POA: Insufficient documentation

## 2020-02-19 DIAGNOSIS — O99611 Diseases of the digestive system complicating pregnancy, first trimester: Secondary | ICD-10-CM | POA: Diagnosis not present

## 2020-02-19 DIAGNOSIS — Z3A10 10 weeks gestation of pregnancy: Secondary | ICD-10-CM | POA: Insufficient documentation

## 2020-02-19 DIAGNOSIS — O3481 Maternal care for other abnormalities of pelvic organs, first trimester: Secondary | ICD-10-CM | POA: Diagnosis not present

## 2020-02-19 DIAGNOSIS — Z79899 Other long term (current) drug therapy: Secondary | ICD-10-CM | POA: Insufficient documentation

## 2020-02-19 DIAGNOSIS — O3410 Maternal care for benign tumor of corpus uteri, unspecified trimester: Secondary | ICD-10-CM

## 2020-02-19 DIAGNOSIS — N83209 Unspecified ovarian cyst, unspecified side: Secondary | ICD-10-CM | POA: Diagnosis not present

## 2020-02-19 DIAGNOSIS — K5090 Crohn's disease, unspecified, without complications: Secondary | ICD-10-CM | POA: Diagnosis not present

## 2020-02-19 DIAGNOSIS — D259 Leiomyoma of uterus, unspecified: Secondary | ICD-10-CM | POA: Diagnosis not present

## 2020-02-19 DIAGNOSIS — O99511 Diseases of the respiratory system complicating pregnancy, first trimester: Secondary | ICD-10-CM | POA: Insufficient documentation

## 2020-02-19 DIAGNOSIS — J45909 Unspecified asthma, uncomplicated: Secondary | ICD-10-CM | POA: Diagnosis not present

## 2020-02-19 DIAGNOSIS — O219 Vomiting of pregnancy, unspecified: Secondary | ICD-10-CM | POA: Insufficient documentation

## 2020-02-19 LAB — CBC
HCT: 38.2 % (ref 36.0–46.0)
Hemoglobin: 12.7 g/dL (ref 12.0–15.0)
MCH: 30.8 pg (ref 26.0–34.0)
MCHC: 33.2 g/dL (ref 30.0–36.0)
MCV: 92.7 fL (ref 80.0–100.0)
Platelets: 283 10*3/uL (ref 150–400)
RBC: 4.12 MIL/uL (ref 3.87–5.11)
RDW: 14.3 % (ref 11.5–15.5)
WBC: 6.5 10*3/uL (ref 4.0–10.5)
nRBC: 0 % (ref 0.0–0.2)

## 2020-02-19 LAB — COMPREHENSIVE METABOLIC PANEL
ALT: 11 U/L (ref 0–44)
AST: 16 U/L (ref 15–41)
Albumin: 3.6 g/dL (ref 3.5–5.0)
Alkaline Phosphatase: 34 U/L — ABNORMAL LOW (ref 38–126)
Anion gap: 9 (ref 5–15)
BUN: 7 mg/dL (ref 6–20)
CO2: 22 mmol/L (ref 22–32)
Calcium: 9.2 mg/dL (ref 8.9–10.3)
Chloride: 106 mmol/L (ref 98–111)
Creatinine, Ser: 0.65 mg/dL (ref 0.44–1.00)
GFR calc Af Amer: >60 mL/min (ref >60)
GFR calc non Af Amer: >60 mL/min (ref >60)
Glucose, Bld: 109 mg/dL — ABNORMAL HIGH (ref 70–99)
Potassium: 3.6 mmol/L (ref 3.5–5.1)
Sodium: 137 mmol/L (ref 135–145)
Total Bilirubin: 1.1 mg/dL (ref 0.3–1.2)
Total Protein: 7.1 g/dL (ref 6.5–8.1)

## 2020-02-19 LAB — URINALYSIS, ROUTINE W REFLEX MICROSCOPIC
Bacteria, UA: NONE SEEN
Bilirubin Urine: NEGATIVE
Glucose, UA: NEGATIVE mg/dL
Hgb urine dipstick: NEGATIVE
Ketones, ur: 5 mg/dL — AB
Nitrite: NEGATIVE
Protein, ur: NEGATIVE mg/dL
Specific Gravity, Urine: 1.032 — ABNORMAL HIGH (ref 1.005–1.030)
pH: 7 (ref 5.0–8.0)

## 2020-02-19 LAB — TYPE AND SCREEN
ABO/RH(D): O POS
Antibody Screen: NEGATIVE

## 2020-02-19 LAB — ABO/RH: ABO/RH(D): O POS

## 2020-02-19 MED ORDER — IOHEXOL 300 MG/ML  SOLN
100.0000 mL | Freq: Once | INTRAMUSCULAR | Status: AC | PRN
  Administered 2020-02-19: 100 mL via INTRAVENOUS

## 2020-02-19 MED ORDER — HYDROMORPHONE HCL 1 MG/ML IJ SOLN
2.0000 mg | INTRAMUSCULAR | Status: DC | PRN
  Administered 2020-02-19: 1 mg via INTRAVENOUS

## 2020-02-19 MED ORDER — HYDROMORPHONE HCL 1 MG/ML IJ SOLN
1.0000 mg | INTRAMUSCULAR | Status: DC | PRN
  Administered 2020-02-19: 1 mg via INTRAVENOUS
  Filled 2020-02-19 (×3): qty 1

## 2020-02-19 MED ORDER — OXYCODONE-ACETAMINOPHEN 2.5-325 MG PO TABS: 1.0000 | ORAL_TABLET | Freq: Four times a day (QID) | ORAL | 0 refills | Status: DC | PRN

## 2020-02-19 MED ORDER — MORPHINE SULFATE (PF) 4 MG/ML IV SOLN
2.0000 mg | INTRAVENOUS | Status: DC | PRN
  Administered 2020-02-19: 2 mg via INTRAVENOUS
  Filled 2020-02-19: qty 1

## 2020-02-19 MED ORDER — ONDANSETRON HCL 4 MG/2ML IJ SOLN
4.0000 mg | Freq: Once | INTRAMUSCULAR | Status: AC
  Administered 2020-02-19: 4 mg via INTRAVENOUS
  Filled 2020-02-19: qty 2

## 2020-02-19 MED ORDER — LACTATED RINGERS IV BOLUS
1000.0000 mL | Freq: Once | INTRAVENOUS | Status: AC
  Administered 2020-02-19: 1000 mL via INTRAVENOUS

## 2020-02-19 NOTE — MAU Note (Signed)
CNM bedside prior to discharge. Patient reports that she feels like she would be able to eat but does not have the desire. Verbalized that she felt like she could keep down food. Tolerated 2 containers of IV contrast while in MAU today.   Patient discharged home. Verbalized that she will follow up in the office this week. Pain medication reviewed and was sent electronically to [place] on file.

## 2020-02-19 NOTE — MAU Provider Note (Signed)
Patient Alexandria Garcia is 34 y.o. G1P0  at 19w2dhere with complaints of sudden onset of nausea/vomiting and pain. She denies vaginal bleeding, cramping, contractions, pain with urination. She has a history of Crohn's disease but her Crohn's has been well controlled for the past 5 months. When she has Crohn's disease flare-up, she develops fistulas.    Patient is very distressed, rocking back and forth on the bed stating that she cannot get comfortable. She states that "she has never felt anything like this" and that her ovarian cyst has never felt like this before.  History     CSN: [id number] Arrival date and time: 02/19/20 02637  First Provider Initiated Contact with Patient 02/19/20 0901      Chief Complaint  Patient presents with  . Nausea  . Emesis   Abdominal Pain This is a new problem. The pain is at a severity of 8/10. The abdominal pain does not radiate. Associated symptoms include nausea and vomiting. Pertinent negatives include no dysuria. Nothing aggravates the pain. The pain is relieved by nothing. She has tried acetaminophen for the symptoms. The treatment provided no relief.    OB History    Gravida  1   Para      Term      Preterm      AB      Living        SAB      TAB      Ectopic      Multiple      Live Births              Past Medical History:  Diagnosis Date  . Anxiety   . Asthma   . 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 BARTHOLIN GLAND CYST EXCISION     X 2  . COLONOSCOPY  02/2012 [facility]   Terminal ILEITIS-Crohn's disease/COLITIS/FISTULA  . DILATATION & CURETTAGE/HYSTEROSCOPY WITH MYOSURE N/A 03/03/2019   Procedure: DILATATION & CURETTAGE/HYSTEROSCOPY WITH MYOSURE;  Surgeon: RPaula Compton MD;  [facility]  Service: Gynecology;  Laterality: N/A;  . MYOMECTOMY N/A 03/03/2019   Procedure: VAGINAL  MYOMECTOMY;  Surgeon: RPaula Compton MD;  [facility]  Service: Gynecology;  Laterality: N/A;  . PILONIDAL CYST EXCISION     X 3    Family History  Problem Relation Age of Onset  . Inflammatory bowel disease Neg Hx   . Celiac disease Neg Hx   . Colon cancer Neg Hx     Social History   Tobacco Use  . Smoking status: Never Smoker  . Smokeless tobacco: Never Used  . Tobacco comment: Never Smoked  Substance Use Topics  . Alcohol use: Yes    Alcohol/week: 0.0 standard drinks    Comment: rare  . Drug use: No    Allergies:  Allergies  Allergen Reactions  . Remicade [Infliximab] Other (See Comments)    RASH ON FACE AND ARMS AFTER 50 CC INFUSION    Medications Prior to Admission  Medication Sig Dispense Refill Last Dose  . albuterol (VENTOLIN HFA) 108 (90 Base) MCG/ACT inhaler Inhale 1-2 puffs into the lungs every 4 (four) hours as needed for wheezing or shortness of breath.     .Marland Kitchenazathioprine (IMURAN) 100 MG tablet Take 150 mg by mouth daily.      . Certolizumab Pegol (CIMZIA) 2  X 200 MG KIT Inject 400 mg into the skin every 30 (thirty) days.      Marland Kitchen ibuprofen (MOTRIN IB) 200 MG tablet Take 3 tablets (600 mg total) by mouth every 6 (six) hours as needed. 30 tablet 0   . sertraline (ZOLOFT) 50 MG tablet Take 50 mg by mouth daily.       Review of Systems  HENT: Negative.   Respiratory: Negative.   Gastrointestinal: Positive for abdominal pain, nausea and vomiting.  Genitourinary: Negative for dysuria, pelvic pain, urgency, vaginal bleeding, vaginal discharge and vaginal pain.  Musculoskeletal: Negative.   Allergic/Immunologic: Negative.   Neurological: Negative.   Psychiatric/Behavioral: Negative.    Physical Exam   Blood pressure 120/85, pulse 64, temperature (!) 97.5 F (36.4 C), temperature source Oral, resp. rate 17, height _0  (1.6 m), weight 74.1 kg, last menstrual period 12/09/2019, SpO2 100 %.  Physical Exam  Constitutional: She is  oriented to person, place, and time. She appears well-developed and well-nourished.  HENT:  Head: Normocephalic.  Respiratory: Effort normal.  GI: Soft. There is abdominal tenderness. There is guarding.  Musculoskeletal:        General: Normal range of motion.     Cervical back: Normal range of motion.  Neurological: She is alert and oriented to person, place, and time.  Skin: Skin is warm and dry.    MAU Course  Procedures  MDM -patient has had dilaudid and morphine while in MAU; signed consent for CT and will go to radiology as soon as contrast is finished.  -Patient had renal US to check for stones> renal US is unremarkable.  -no white count, afebrile.  CT shows no evidence of appendicitis, no intra-abdominal infection; large ovarian cyst noted along with degenerating fibroid.  -bedside US shows fetus with cardiac activity.     Assessment and Plan   1. Ovarian cyst affecting pregnancy in first trimester, antepartum   2. Uterine fibroid during pregnancy, antepartum    2. Rx for percocet given; declined Zofran for home. Strict return precautions given; return to MAU if fever, vomiting, abdominal swelling, vaginal bleeding, vaginal discharge or any other ob-gyn concerns.  3. Dr. Marvel Plan will schedule patient for appt this week. Dr. Marvel Plan called unit; suggesting that patient have PO challenge>patient feels like she can eat if she needs to, she does not feel like eating right now and wants to go home. Dr.Richardson made aware; agrees with plan.    Jhayla Podgorski Teren Franckowiak 02/19/2020, 9:06 AM

## 2020-02-19 NOTE — MAU Note (Signed)
PT reports nausea and vomiting x 2 hours w/ right-sided abdominal pain. Denies vaginal bleeding or discharge.

## 2020-02-19 NOTE — Discharge Instructions (Signed)
Ovarian Cyst An ovarian cyst is a fluid-filled sac on an ovary. The ovaries are organs that make eggs in women. Most ovarian cysts go away on their own and are not cancerous (are benign). Some cysts need treatment. Follow these instructions at home:  Take over-the-counter and prescription medicines only as told by your doctor.  Do not drive or use heavy machinery while taking prescription pain medicine.  Get pelvic exams and Pap tests as often as told by your doctor.  Return to your normal activities as told by your doctor. Ask your doctor what activities are safe for you.  Do not use any products that contain nicotine or tobacco, such as cigarettes and e-cigarettes. If you need help quitting, ask your doctor.  Keep all follow-up visits as told by your doctor. This is important. Contact a doctor if:  Your periods are: ? Late. ? Irregular. ? Painful.   Your periods stop.  You have pelvic pain that does not go away.  You have pressure on your bladder.  You have trouble making your bladder empty when you pee (urinate).  You have pain during sex.  You have any of the following in your belly (abdomen): ? A feeling of fullness. ? Pressure. ? Discomfort. ? Pain that does not go away. ? Swelling.  You feel sick most of the time.  You have trouble pooping (have constipation).  You are not as hungry as usual (you lose your appetite).  You get very bad acne.  You start to have more hair on your body and face.  You are gaining weight or losing weight without changing your exercise and eating habits.  You think you may be pregnant. Get help right away if:  You have belly pain that is very bad or gets worse.  You cannot eat or drink without throwing up (vomiting).  You suddenly get a fever.  Your period is a lot heavier than usual. This information is not intended to replace advice given to you by your health care provider. Make sure you discuss any questions you have  with your health care provider. Document Revised: 08/28/2017 Document Reviewed: 02/17/2016 Elsevier Patient Education  2020 [employer].

## 2020-03-23 ENCOUNTER — Encounter (HOSPITAL_COMMUNITY): Payer: Self-pay | Admitting: Obstetrics and Gynecology

## 2020-03-23 ENCOUNTER — Inpatient Hospital Stay (HOSPITAL_COMMUNITY)
Admission: AD | Admit: 2020-03-23 | Discharge: 2020-03-23 | Disposition: A | Discharge status: Home / Self Care | Payer: 59 | Attending: Obstetrics and Gynecology | Admitting: Obstetrics and Gynecology

## 2020-03-23 ENCOUNTER — Inpatient Hospital Stay (HOSPITAL_BASED_OUTPATIENT_CLINIC_OR_DEPARTMENT_OTHER): Payer: 59

## 2020-03-23 DIAGNOSIS — O99512 Diseases of the respiratory system complicating pregnancy, second trimester: Secondary | ICD-10-CM | POA: Insufficient documentation

## 2020-03-23 DIAGNOSIS — Z3A15 15 weeks gestation of pregnancy: Secondary | ICD-10-CM | POA: Diagnosis not present

## 2020-03-23 DIAGNOSIS — O3412 Maternal care for benign tumor of corpus uteri, second trimester: Secondary | ICD-10-CM | POA: Diagnosis not present

## 2020-03-23 DIAGNOSIS — J45909 Unspecified asthma, uncomplicated: Secondary | ICD-10-CM | POA: Insufficient documentation

## 2020-03-23 DIAGNOSIS — K50813 Crohn's disease of both small and large intestine with fistula: Secondary | ICD-10-CM | POA: Insufficient documentation

## 2020-03-23 DIAGNOSIS — O26892 Other specified pregnancy related conditions, second trimester: Secondary | ICD-10-CM | POA: Insufficient documentation

## 2020-03-23 DIAGNOSIS — D259 Leiomyoma of uterus, unspecified: Secondary | ICD-10-CM

## 2020-03-23 DIAGNOSIS — R109 Unspecified abdominal pain: Secondary | ICD-10-CM

## 2020-03-23 DIAGNOSIS — O99612 Diseases of the digestive system complicating pregnancy, second trimester: Secondary | ICD-10-CM | POA: Diagnosis not present

## 2020-03-23 LAB — CBC WITH DIFFERENTIAL/PLATELET
Abs Immature Granulocytes: 0.03 10*3/uL (ref 0.00–0.07)
Basophils Absolute: 0.1 10*3/uL (ref 0.0–0.1)
Basophils Relative: 1 %
Eosinophils Absolute: 0 10*3/uL (ref 0.0–0.5)
Eosinophils Relative: 0 %
HCT: 37.3 % (ref 36.0–46.0)
Hemoglobin: 12.7 g/dL (ref 12.0–15.0)
Immature Granulocytes: 0 %
Lymphocytes Relative: 12 %
Lymphs Abs: 1.1 10*3/uL (ref 0.7–4.0)
MCH: 31.6 pg (ref 26.0–34.0)
MCHC: 34 g/dL (ref 30.0–36.0)
MCV: 92.8 fL (ref 80.0–100.0)
Monocytes Absolute: 0.6 10*3/uL (ref 0.1–1.0)
Monocytes Relative: 6 %
Neutro Abs: 7.2 10*3/uL (ref 1.7–7.7)
Neutrophils Relative %: 81 %
Platelets: 278 10*3/uL (ref 150–400)
RBC: 4.02 MIL/uL (ref 3.87–5.11)
RDW: 13.3 % (ref 11.5–15.5)
WBC: 9 10*3/uL (ref 4.0–10.5)
nRBC: 0 % (ref 0.0–0.2)

## 2020-03-23 LAB — URINALYSIS, ROUTINE W REFLEX MICROSCOPIC
Bacteria, UA: NONE SEEN
Bilirubin Urine: NEGATIVE
Glucose, UA: NEGATIVE mg/dL
Hgb urine dipstick: NEGATIVE
Ketones, ur: 80 mg/dL — AB
Leukocytes,Ua: NEGATIVE
Nitrite: NEGATIVE
Protein, ur: 30 mg/dL — AB
Specific Gravity, Urine: 1.032 — ABNORMAL HIGH (ref 1.005–1.030)
pH: 5 (ref 5.0–8.0)

## 2020-03-23 LAB — COMPREHENSIVE METABOLIC PANEL
ALT: 10 U/L (ref 0–44)
AST: 15 U/L (ref 15–41)
Albumin: 3.5 g/dL (ref 3.5–5.0)
Alkaline Phosphatase: 29 U/L — ABNORMAL LOW (ref 38–126)
Anion gap: 10 (ref 5–15)
BUN: 9 mg/dL (ref 6–20)
CO2: 22 mmol/L (ref 22–32)
Calcium: 8.7 mg/dL — ABNORMAL LOW (ref 8.9–10.3)
Chloride: 105 mmol/L (ref 98–111)
Creatinine, Ser: 0.68 mg/dL (ref 0.44–1.00)
GFR calc Af Amer: >60 mL/min (ref >60)
GFR calc non Af Amer: >60 mL/min (ref >60)
Glucose, Bld: 97 mg/dL (ref 70–99)
Potassium: 3.7 mmol/L (ref 3.5–5.1)
Sodium: 137 mmol/L (ref 135–145)
Total Bilirubin: 0.9 mg/dL (ref 0.3–1.2)
Total Protein: 6.7 g/dL (ref 6.5–8.1)

## 2020-03-23 LAB — TYPE AND SCREEN
ABO/RH(D): O POS
Antibody Screen: NEGATIVE

## 2020-03-23 MED ORDER — DEXTROSE IN LACTATED RINGERS 5 % IV SOLN: INTRAVENOUS | Status: DC

## 2020-03-23 MED ORDER — PROGESTERONE MICRONIZED 100 MG PO CAPS
100.0000 mg | ORAL_CAPSULE | Freq: Every day | ORAL | 5 refills | Status: DC
Start: 2020-03-23 — End: 2020-09-12

## 2020-03-23 MED ORDER — LACTATED RINGERS IV BOLUS
1000.0000 mL | Freq: Once | INTRAVENOUS | Status: AC
  Administered 2020-03-23: 1000 mL via INTRAVENOUS

## 2020-03-23 MED ORDER — ONDANSETRON 8 MG PO TBDP
8.0000 mg | ORAL_TABLET | Freq: Three times a day (TID) | ORAL | 0 refills | Status: DC | PRN
Start: 2020-03-23 — End: 2020-09-12

## 2020-03-23 MED ORDER — OXYCODONE HCL 5 MG PO TABS: 5.0000 mg | ORAL_TABLET | ORAL | 0 refills | Status: DC | PRN

## 2020-03-23 NOTE — MAU Provider Note (Signed)
History     CSN: [id number]  Arrival date and time: 03/23/20 1156   None     Chief Complaint  Patient presents with   Abdominal Pain   HPI  [name] is a 34 y.o. female G1P0 @ [redacted]w[redacted]d here in MAU with abdominal pain. She was brought in by EMS. She reports onset of pain on Monday which progressed to 10/10 pain today. She was unable to talk or get out of the bathtub d/t the pain.  She has known uterine fibroids and ?ovarian cyst. She had this pain back in May and had a negative CT for acute appendicitis. States she has been taking ibuprofen around the clock. When her pain reached 10/10 she was unable to take anything else that she had at home for pain. She has oxycodone at home and takes it at night if needed. States she has had decreased appetite d/t the pain this week.   Patient present today with her mother.    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 ([place])    Diagnosed 2013    Past Surgical History:  Procedure Laterality Date   BARTHOLIN GLAND CYST EXCISION     X 2   COLONOSCOPY  02/2012 [facility]   Terminal ILEITIS-Crohn's disease/COLITIS/FISTULA   DILATATION & CURETTAGE/HYSTEROSCOPY WITH MYOSURE N/A 03/03/2019   Procedure: DILATATION & CURETTAGE/HYSTEROSCOPY WITH MYOSURE;  Surgeon: Paula Compton, MD;  Location: [place];  Service: Gynecology;  Laterality: N/A;   MYOMECTOMY N/A 03/03/2019   Procedure: VAGINAL MYOMECTOMY;  Surgeon: Paula Compton, MD;  Location: [facility];  Service: Gynecology;  Laterality: N/A;   PILONIDAL CYST EXCISION     X 3    Family History  Problem Relation Age of Onset   Inflammatory bowel disease Neg Hx    Celiac disease Neg Hx    Colon cancer Neg Hx     Social History   Tobacco Use   Smoking status: Never Smoker   Smokeless tobacco: Never Used   Tobacco comment: Never Smoked  Vaping Use   Vaping  Use: Never used  Substance Use Topics   Alcohol use: Yes    Alcohol/week: 0.0 standard drinks    Comment: rare   Drug use: No    Allergies:  Allergies  Allergen Reactions   Infliximab Other (See Comments) and Anaphylaxis    RASH ON FACE AND ARMS AFTER 50 CC INFUSION RASH ON FACE AND ARMS AFTER 50 CC INFUSION Other reaction(s): Other (See Comments) RASH ON FACE AND ARMS AFTER 50 CC INFUSION     No medications prior to admission.   Results for orders placed or performed during the hospital encounter of 03/23/20 (from the past 48 hour(s))  CBC with Differential/Platelet     Status: None   Collection Time: 03/23/20 12:40 PM  Result Value Ref Range   WBC 9.0 4.0 - 10.5 K/uL   RBC 4.02 3.87 - 5.11 MIL/uL   Hemoglobin 12.7 12.0 - 15.0 g/dL   HCT 37.3 36 - 46 %   MCV 92.8 80.0 - 100.0 fL   MCH 31.6 26.0 - 34.0 pg   MCHC 34.0 30.0 - 36.0 g/dL   RDW 13.3 11.5 - 15.5 %   Platelets 278 150 - 400 K/uL   nRBC 0.0 0.0 - 0.2 %   Neutrophils Relative % 81 %   Neutro Abs 7.2 1.7 - 7.7  K/uL   Lymphocytes Relative 12 %   Lymphs Abs 1.1 0.7 - 4.0 K/uL   Monocytes Relative 6 %   Monocytes Absolute 0.6 0 - 1 K/uL   Eosinophils Relative 0 %   Eosinophils Absolute 0.0 0 - 0 K/uL   Basophils Relative 1 %   Basophils Absolute 0.1 0 - 0 K/uL   Immature Granulocytes 0 %   Abs Immature Granulocytes 0.03 0.00 - 0.07 K/uL    Comment: Performed at [facility] [address], [place], [place] [address]  Comprehensive metabolic panel     Status: Abnormal   Collection Time: 03/23/20 12:40 PM  Result Value Ref Range   Sodium 137 135 - 145 mmol/L   Potassium 3.7 3.5 - 5.1 mmol/L   Chloride 105 98 - 111 mmol/L   CO2 22 22 - 32 mmol/L   Glucose, Bld 97 70 - 99 mg/dL    Comment: Glucose reference range applies only to samples taken after fasting for at least 8 hours.   BUN 9 6 - 20 mg/dL   Creatinine, Ser 0.68 0.44 - 1.00 mg/dL   Calcium 8.7 (L) 8.9 - 10.3 mg/dL   Total Protein 6.7 6.5  - 8.1 g/dL   Albumin 3.5 3.5 - 5.0 g/dL   AST 15 15 - 41 U/L   ALT 10 0 - 44 U/L   Alkaline Phosphatase 29 (L) 38 - 126 U/L   Total Bilirubin 0.9 0.3 - 1.2 mg/dL   GFR calc non Af Amer >60 >60 mL/min   GFR calc Af Amer >60 >60 mL/min   Anion gap 10 5 - 15    Comment: Performed at [facility], [place] [address] [place], [place] [address]  Urinalysis, Routine w reflex microscopic     Status: Abnormal   Collection Time: 03/23/20 12:41 PM  Result Value Ref Range   Color, Urine YELLOW YELLOW   APPearance CLEAR CLEAR   Specific Gravity, Urine 1.032 (H) 1.005 - 1.030   pH 5.0 5.0 - 8.0   Glucose, UA NEGATIVE NEGATIVE mg/dL   Hgb urine dipstick NEGATIVE NEGATIVE   Bilirubin Urine NEGATIVE NEGATIVE   Ketones, ur 80 (A) NEGATIVE mg/dL   Protein, ur 30 (A) NEGATIVE mg/dL   Nitrite NEGATIVE NEGATIVE   Leukocytes,Ua NEGATIVE NEGATIVE   RBC / HPF 0-5 0 - 5 RBC/hpf   WBC, UA 0-5 0 - 5 WBC/hpf   Bacteria, UA NONE SEEN NONE SEEN   Mucus PRESENT    Hyaline Casts, UA PRESENT    Granular Casts, UA PRESENT     Comment: Performed at [facility] [address] [place], [place] [address]  Type and screen Ray     Status: None   Collection Time: 03/23/20  2:25 PM  Result Value Ref Range   ABO/RH(D) O POS    Antibody Screen NEG    Sample Expiration      03/26/2020,2359 Performed at [facility], [place] [address] [place], [place] [address]    [place] MFM OB LIMITED  Result Date: 03/23/2020 ----------------------------------------------------------------------  OBSTETRICS REPORT                       (Signed Final 03/23/2020 05:32 pm) ---------------------------------------------------------------------- Patient Info  ID #:       [id number]  D.O.B.:  29-Jan-1986 (33 yrs)  Name:       Alexandria Garcia                Visit Date: 03/23/2020 01:39 pm ---------------------------------------------------------------------- Performed By  Attending:         Johnell Comings MD         Referred By:      [facility] MAU/Triage  Performed By:     Hubert Azure          [facility] ---------------------------------------------------------------------- Orders  #  Description                           Code        Ordered By  1  [place] MFM OB LIMITED                     [phone]    Ova Meegan ----------------------------------------------------------------------  #  Order #                     Accession #                Episode #  1  [id number]                   [id number]                 [id number] ---------------------------------------------------------------------- Indications  Pelvic pain affecting pregnancy in second      O26.892  trimester (chronic)  [redacted] weeks gestation of pregnancy                Z3A.15  Uterine fibroids affecting pregnancy in        O34.12, D25.9  second trimester, antepartum ---------------------------------------------------------------------- Fetal Evaluation  Num Of Fetuses:         1  Fetal Heart Rate(bpm):  148  Cardiac Activity:       Observed  Presentation:           Breech  Placenta:               Anterior  Amniotic Fluid  AFI FV:      Within normal limits                              Largest Pocket(cm)                              4.1 ---------------------------------------------------------------------- OB History  Gravidity:    1 ---------------------------------------------------------------------- Gestational Age  LMP:           15w 0d        Date:  12/09/19                 EDD:   09/14/20  Best:          Neena Rhymes 0d     Det. By:  LMP  (12/09/19)          EDD:  09/14/20 ---------------------------------------------------------------------- Cervix Uterus Adnexa  Cervix  Not visualized (early gestational age <16 weeks)  Uterus  Multiple fibroids noted, see table below.  Right Ovary  Within normal limits.  Left Ovary  Not visualized.  Adnexa  No abnormality  visualized. ---------------------------------------------------------------------- Myomas  Site                     L(cm)      W(cm)      D(cm)       Location  Anterior left            8.5        5.2        6.5  Anterior fundal lt       4.9        4.7        6.1  Posterior rt             3.7        3.2        3.8 ----------------------------------------------------------------------  Blood Flow                  RI       PI       Comments ---------------------------------------------------------------------- Comments  A limited ultrasound performed today shows that the fetus is  in the breech presentation.  There was normal amniotic fluid noted.  Multiple fibroids with the largest measuring about 8-1/2 cm  were noted throughout her uterus today.  The patient should be referred to the MFM office for a  detailed fetal anatomy scan at around 19 weeks. ----------------------------------------------------------------------                   Johnell Comings, MD Electronically Signed Final Report   03/23/2020 05:32 pm ----------------------------------------------------------------------   Review of Systems  Constitutional: Negative for fever.  Gastrointestinal: Positive for abdominal pain and nausea. Negative for vomiting.  Genitourinary: Negative for vaginal bleeding and vaginal discharge.   Physical Exam   Blood pressure 127/66, pulse 77, temperature 97.9 F (36.6 C), temperature source Oral, resp. rate 16, last menstrual period 12/09/2019, SpO2 100 %.  Physical Exam Constitutional:      Appearance: She is well-developed.  HENT:     Head: Normocephalic.  Eyes:     Extraocular Movements: Extraocular movements intact.  Abdominal:     General: Abdomen is flat.     Tenderness: There is generalized abdominal tenderness. There is no guarding or rebound.  Genitourinary:    Comments: Cervix closed, thick, posterior  Skin:    General: Skin is warm.  Neurological:     Mental Status: She is alert.    MAU  Course  Procedures  MDM  + fetal heart tones  CBC with diff CMP UA with urine culture UA shows > 80 ketones. LR bolus x 2 US shows multiple fibroids.  Discussed [place] with Allysssa with MFM who verbally reports the right ovary has + blood flow.  Discussed patient with Dr. Marvel Plan; discussed HPI, exam and [place] Patient now rating pain 0/10- she is comfortable in MAU. She ate a full meal and is drinking without N/V Discussed pain management at home.   Assessment and Plan   A:  1. Uterine fibroids affecting pregnancy in second trimester   2. Abdominal pain in pregnancy, second trimester   3. [redacted] weeks gestation of pregnancy     P:  Discharge home with strict return precautions Rx: Roxicodone, zofran, progesterone  Follow up with  [facility] next week Return to MAU if symptoms worsen Small, frequent meals.    Lezlie Lye, NP 03/23/2020 7:17 PM

## 2020-03-23 NOTE — Discharge Instructions (Signed)
Uterine Fibroids  Uterine fibroids are lumps of tissue (tumors) in your womb (uterus). They are not cancer (are benign). Most women with this condition do not need treatment. Sometimes fibroids can affect your ability to have children (your fertility). If that happens, you may need surgery to take out the fibroids. Follow these instructions at home:  Take over-the-counter and prescription medicines only as told by your doctor. Your doctor may suggest NSAIDs (such as aspirin or ibuprofen) to help with pain.  Ask your doctor if you should: ? Take iron pills. ? Eat more foods that have iron in them, such as dark green, leafy vegetables.  If directed, apply heat to your back or belly to reduce pain. Use the heat source that your doctor recommends, such as a moist heat pack or a heating pad. ? Put a towel between your skin and the heat source. ? Leave the heat on for 20-30 minutes. ? Remove the heat if your skin turns bright red. This is especially important if you are unable to feel pain, heat, or cold. You may have a greater risk of getting burned.  Pay close attention to your period (menstrual) cycles. Tell your doctor about any changes, such as: ? A heavier blood flow than usual. ? Needing to use more pads or tampons than normal. ? A change in how many days your period lasts. ? A change in symptoms that come with your period, such as cramps or back pain.  Keep all follow-up visits as told by your doctor. This is important. Your doctor may need to watch your fibroids over time for any changes. Contact a doctor if you:  Have pain that does not get better with medicine or heat, such as pain or cramps in: ? Your back. ? The area between your hip bones (pelvic area). ? Your belly.  Have new bleeding between your periods.  Have more bleeding during or between your periods.  Feel very tired or weak.  Feel light-headed. Get help right away if you:  Pass out (faint).  Have pain in the  area between your hip bones that suddenly gets worse.  Have bleeding that soaks a tampon or pad in 30 minutes or less. Summary  Uterine fibroids are lumps of tissue (tumors) in your womb (uterus). They are not cancer.  The only treatment that most women need is taking aspirin or ibuprofen for pain.  Contact a doctor if you have pain or cramps that do not get better with medicine.  Make sure you know what symptoms you should get help for right away. This information is not intended to replace advice given to you by your health care provider. Make sure you discuss any questions you have with your health care provider. Document Revised: 08/28/2017 Document Reviewed: 08/11/2017 Elsevier Patient Education  2020 [employer]  

## 2020-03-23 NOTE — MAU Note (Signed)
Pt arrived EMS with right sided abdominal pain that she has been having since 0200 Monday. States she has been taking Tylenol, ibuprofen, and percocet for the pain but today it did not help. States she could not get out of the tub the pain was so severe. Pt describes the pain as sharp and stabbing. Rating it a 3-4/10 currently after 4 mg of morphine on the EMS truck. States it is constant with periods of increasing intensity. Reports having fibroids. Denies LOF/VB.

## 2020-03-24 LAB — CULTURE, OB URINE: Culture: NO GROWTH

## 2020-04-10 ENCOUNTER — Other Ambulatory Visit: Payer: Self-pay | Admitting: Obstetrics and Gynecology

## 2020-04-10 DIAGNOSIS — N61 Mastitis without abscess: Secondary | ICD-10-CM

## 2020-04-10 DIAGNOSIS — N63 Unspecified lump in unspecified breast: Secondary | ICD-10-CM

## 2020-04-12 ENCOUNTER — Other Ambulatory Visit: Payer: Self-pay | Admitting: Obstetrics and Gynecology

## 2020-04-12 ENCOUNTER — Other Ambulatory Visit: Payer: Self-pay

## 2020-04-12 ENCOUNTER — Ambulatory Visit
Admission: RE | Admit: 2020-04-12 | Discharge: 2020-04-12 | Disposition: A | Discharge status: Home / Self Care | Payer: 59 | Source: Ambulatory Visit | Attending: Obstetrics and Gynecology | Admitting: Obstetrics and Gynecology

## 2020-04-12 DIAGNOSIS — N63 Unspecified lump in unspecified breast: Secondary | ICD-10-CM

## 2020-04-12 DIAGNOSIS — N61 Mastitis without abscess: Secondary | ICD-10-CM

## 2020-04-23 ENCOUNTER — Ambulatory Visit
Admission: RE | Admit: 2020-04-23 | Discharge: 2020-04-23 | Disposition: A | Discharge status: Home / Self Care | Payer: 59 | Source: Ambulatory Visit | Attending: Obstetrics and Gynecology | Admitting: Obstetrics and Gynecology

## 2020-04-23 ENCOUNTER — Other Ambulatory Visit: Payer: Self-pay | Admitting: Obstetrics and Gynecology

## 2020-04-23 ENCOUNTER — Other Ambulatory Visit: Payer: Self-pay

## 2020-04-23 DIAGNOSIS — N61 Mastitis without abscess: Secondary | ICD-10-CM

## 2020-04-23 DIAGNOSIS — N63 Unspecified lump in unspecified breast: Secondary | ICD-10-CM

## 2020-07-25 ENCOUNTER — Other Ambulatory Visit: Payer: 59

## 2020-08-20 ENCOUNTER — Ambulatory Visit
Admission: RE | Admit: 2020-08-20 | Discharge: 2020-08-20 | Disposition: A | Discharge status: Home / Self Care | Payer: 59 | Source: Ambulatory Visit | Attending: Obstetrics and Gynecology | Admitting: Obstetrics and Gynecology

## 2020-08-20 ENCOUNTER — Other Ambulatory Visit: Payer: Self-pay

## 2020-08-20 DIAGNOSIS — N63 Unspecified lump in unspecified breast: Secondary | ICD-10-CM

## 2020-08-20 DIAGNOSIS — N61 Mastitis without abscess: Secondary | ICD-10-CM

## 2020-08-27 ENCOUNTER — Telehealth (HOSPITAL_COMMUNITY): Payer: Self-pay | Admitting: *Deleted

## 2020-08-27 ENCOUNTER — Encounter (HOSPITAL_COMMUNITY): Payer: Self-pay

## 2020-08-27 NOTE — Telephone Encounter (Signed)
Preadmission screen  

## 2020-08-27 NOTE — Patient Instructions (Addendum)
Alexandria Garcia  08/27/2020   Your procedure is scheduled on:  09/10/2020  Arrive at [facility] at Entrance C on [address] at [facility]  and [employer]. You are invited to use the FREE valet parking or use the Visitor's parking deck.  Pick up the phone at the desk and dial [phone].  Call this number if you have problems the morning of surgery: [phone]  Remember:   Do not eat food:(After Midnight) Desps de medianoche.  Do not drink clear liquids: (After Midnight) Desps de medianoche.  Take these medicines the morning of surgery with A SIP OF WATER:  Take azathioprine as prescribed   Do not wear jewelry, make-up or nail polish.  Do not wear lotions, powders, or perfumes. Do not wear deodorant.  Do not shave 48 hours prior to surgery.  Do not bring valuables to the hospital.  [facility] is not   responsible for any belongings or valuables brought to the hospital.  Contacts, dentures or bridgework may not be worn into surgery.  Leave suitcase in the car. After surgery it may be brought to your room.  For patients admitted to the hospital, checkout time is 11:00 AM the day of              discharge.      Please read over the following fact sheets that you were given:     Preparing for Surgery

## 2020-08-28 ENCOUNTER — Encounter (HOSPITAL_COMMUNITY): Payer: Self-pay

## 2020-09-07 ENCOUNTER — Encounter (HOSPITAL_COMMUNITY)
Admission: RE | Admit: 2020-09-07 | Discharge: 2020-09-07 | Disposition: A | Discharge status: Home / Self Care | Payer: 59 | Source: Ambulatory Visit | Attending: Obstetrics and Gynecology | Admitting: Obstetrics and Gynecology

## 2020-09-07 ENCOUNTER — Other Ambulatory Visit (HOSPITAL_COMMUNITY)
Admission: RE | Admit: 2020-09-07 | Discharge: 2020-09-07 | Disposition: A | Discharge status: Home / Self Care | Payer: 59 | Source: Ambulatory Visit | Attending: Obstetrics and Gynecology | Admitting: Obstetrics and Gynecology

## 2020-09-07 ENCOUNTER — Other Ambulatory Visit: Payer: Self-pay

## 2020-09-07 DIAGNOSIS — Z20822 Contact with and (suspected) exposure to covid-19: Secondary | ICD-10-CM | POA: Insufficient documentation

## 2020-09-07 DIAGNOSIS — Z01812 Encounter for preprocedural laboratory examination: Secondary | ICD-10-CM | POA: Insufficient documentation

## 2020-09-07 LAB — CBC
HCT: 34.2 % — ABNORMAL LOW (ref 36.0–46.0)
Hemoglobin: 11.6 g/dL — ABNORMAL LOW (ref 12.0–15.0)
MCH: 29.8 pg (ref 26.0–34.0)
MCHC: 33.9 g/dL (ref 30.0–36.0)
MCV: 87.9 fL (ref 80.0–100.0)
Platelets: 218 10*3/uL (ref 150–400)
RBC: 3.89 MIL/uL (ref 3.87–5.11)
RDW: 12.5 % (ref 11.5–15.5)
WBC: 6.9 10*3/uL (ref 4.0–10.5)
nRBC: 0 % (ref 0.0–0.2)

## 2020-09-07 LAB — SARS CORONAVIRUS 2 (TAT 6-24 HRS): SARS Coronavirus 2: NEGATIVE

## 2020-09-07 LAB — TYPE AND SCREEN
ABO/RH(D): O POS
Antibody Screen: NEGATIVE

## 2020-09-07 LAB — RPR: RPR Ser Ql: NONREACTIVE

## 2020-09-09 NOTE — H&P (Signed)
Alexandria Garcia is a 34 y.o. 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 Her prenatal care is significant for the Crohn's well-controlled on cimzia and azathioprin.  Review of these medications antenatally showed  benefits outweigh risks per gi no known issues for fetus except avoid live vaccines for 6 months pp for baby. She also has multiple fibroids with the largest one at 7cm, they caused sybstantial pain in the first trimester but that resolved. 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  OB History    Gravida  1   Para      Term      Preterm      AB      Living        SAB      IAB      Ectopic      Multiple      Live Births             Past Medical History:  Diagnosis Date  . Anxiety   . Asthma   . Crohn's disease of both small and large intestine with fistula ([place])    Diagnosed 2013   Past Surgical History:  Procedure Laterality Date  . BARTHOLIN GLAND CYST EXCISION     X 2  . COLONOSCOPY  02/2012 J[facility]   Terminal ILEITIS-Crohn's disease/COLITIS/FISTULA  . DILATATION & CURETTAGE/HYSTEROSCOPY WITH MYOSURE N/A 03/03/2019   Procedure: DILATATION & CURETTAGE/HYSTEROSCOPY WITH MYOSURE;  Surgeon: Paula Compton, MD;  Location: [facility];  Service: Gynecology;  Laterality: N/A;  . MYOMECTOMY N/A 03/03/2019   Procedure: VAGINAL MYOMECTOMY;  Surgeon: Paula Compton, MD;  Location: [facility];  Service: Gynecology;  Laterality: N/A;  . PILONIDAL CYST EXCISION     X 3  . WISDOM TOOTH EXTRACTION     Family History: family history includes Hypertension in her mother. Social History:  reports that she has never smoked. She has never used smokeless tobacco. She reports current alcohol use. She reports that she does not use  drugs.     Maternal Diabetes: No Genetic Screening: Declined Maternal Ultrasounds/Referrals: Normal Fetal Ultrasounds or other Referrals:  None Maternal Substance Abuse:  No Significant Maternal Medications:  Meds include: Zoloft, cimzia, azathioprine Significant Maternal Lab Results:  Group B Strep positive Other Comments:  None  Review of Systems  Constitutional: Negative for fever.  Gastrointestinal: Negative for abdominal pain.   Maternal Medical History:  Contractions: Frequency: irregular.   Perceived severity is mild.    Fetal activity: Perceived fetal activity is normal.    Prenatal Complications - Diabetes: none.      Last menstrual period 12/09/2019. Maternal Exam:  Uterine Assessment: Contraction strength is mild.  Contraction frequency is irregular.   Abdomen: Patient reports no abdominal tenderness. Introitus: Normal vagina.    Physical Exam Cardiovascular:     Rate and Rhythm: Normal rate and regular rhythm.  Pulmonary:     Effort: Pulmonary effort is normal.  Abdominal:     Palpations: Abdomen is soft.     Comments: Pfannenstiel incision  Genitourinary:    Comments: Has a chronic rectal/perineal fistula Neurological:     Mental Status: She is alert.  Psychiatric:        Mood and Affect: Mood normal.     Prenatal labs: ABO, Rh: --/--/  O POS (12/10 0920) Antibody: NEG (12/10 0920) Rubella: Immune (05/18 0000) RPR: NON REACTIVE (12/10 1000)  HBsAg: Negative (05/18 0000)  HIV: Non-reactive (05/18 0000)  GBS:   Positive One hour GCT 128 Hgb AA  Assessment/Plan:  d/w pt risks and benefits of c-section in detail including bleeding, infection and possible damage to bowel and bladder.  We discussed transfusion and slightly increased risk of atony with fibroids.  She is ready to proceed  Logan Bores 09/09/2020, 7:35 AM

## 2020-09-10 ENCOUNTER — Other Ambulatory Visit: Payer: Self-pay

## 2020-09-10 ENCOUNTER — Encounter (HOSPITAL_COMMUNITY): Payer: Self-pay | Admitting: Obstetrics and Gynecology

## 2020-09-10 ENCOUNTER — Inpatient Hospital Stay (HOSPITAL_COMMUNITY): Payer: 59 | Admitting: Certified Registered Nurse Anesthetist

## 2020-09-10 ENCOUNTER — Encounter (HOSPITAL_COMMUNITY)
Admission: RE | Disposition: A | Discharge status: Home / Self Care | Payer: Self-pay | Source: Home / Self Care | Attending: Obstetrics and Gynecology

## 2020-09-10 ENCOUNTER — Inpatient Hospital Stay (HOSPITAL_COMMUNITY)
Admission: RE | Admit: 2020-09-10 | Discharge: 2020-09-12 | DRG: 787 | Disposition: A | Discharge status: Home / Self Care | Payer: 59 | Attending: Obstetrics and Gynecology | Admitting: Obstetrics and Gynecology

## 2020-09-10 DIAGNOSIS — O9081 Anemia of the puerperium: Secondary | ICD-10-CM | POA: Diagnosis not present

## 2020-09-10 DIAGNOSIS — F419 Anxiety disorder, unspecified: Secondary | ICD-10-CM | POA: Diagnosis present

## 2020-09-10 DIAGNOSIS — Z20822 Contact with and (suspected) exposure to covid-19: Secondary | ICD-10-CM | POA: Diagnosis present

## 2020-09-10 DIAGNOSIS — O99824 Streptococcus B carrier state complicating childbirth: Secondary | ICD-10-CM | POA: Diagnosis present

## 2020-09-10 DIAGNOSIS — O9962 Diseases of the digestive system complicating childbirth: Principal | ICD-10-CM | POA: Diagnosis present

## 2020-09-10 DIAGNOSIS — O99344 Other mental disorders complicating childbirth: Secondary | ICD-10-CM | POA: Diagnosis present

## 2020-09-10 DIAGNOSIS — D62 Acute posthemorrhagic anemia: Secondary | ICD-10-CM | POA: Diagnosis not present

## 2020-09-10 DIAGNOSIS — K5090 Crohn's disease, unspecified, without complications: Secondary | ICD-10-CM | POA: Diagnosis present

## 2020-09-10 DIAGNOSIS — K604 Rectal fistula: Secondary | ICD-10-CM | POA: Diagnosis present

## 2020-09-10 DIAGNOSIS — D259 Leiomyoma of uterus, unspecified: Secondary | ICD-10-CM | POA: Diagnosis present

## 2020-09-10 DIAGNOSIS — O3413 Maternal care for benign tumor of corpus uteri, third trimester: Secondary | ICD-10-CM | POA: Diagnosis present

## 2020-09-10 DIAGNOSIS — Z3A39 39 weeks gestation of pregnancy: Secondary | ICD-10-CM | POA: Diagnosis not present

## 2020-09-10 DIAGNOSIS — Z98891 History of uterine scar from previous surgery: Secondary | ICD-10-CM

## 2020-09-10 LAB — CBC WITH DIFFERENTIAL/PLATELET
Abs Immature Granulocytes: 0.09 10*3/uL — ABNORMAL HIGH (ref 0.00–0.07)
Basophils Absolute: 0.1 10*3/uL (ref 0.0–0.1)
Basophils Relative: 1 %
Eosinophils Absolute: 0 10*3/uL (ref 0.0–0.5)
Eosinophils Relative: 0 %
HCT: 32.2 % — ABNORMAL LOW (ref 36.0–46.0)
Hemoglobin: 10.9 g/dL — ABNORMAL LOW (ref 12.0–15.0)
Immature Granulocytes: 1 %
Lymphocytes Relative: 5 %
Lymphs Abs: 0.8 10*3/uL (ref 0.7–4.0)
MCH: 29.8 pg (ref 26.0–34.0)
MCHC: 33.9 g/dL (ref 30.0–36.0)
MCV: 88 fL (ref 80.0–100.0)
Monocytes Absolute: 0.5 10*3/uL (ref 0.1–1.0)
Monocytes Relative: 3 %
Neutro Abs: 15.1 10*3/uL — ABNORMAL HIGH (ref 1.7–7.7)
Neutrophils Relative %: 90 %
Platelets: 243 10*3/uL (ref 150–400)
RBC: 3.66 MIL/uL — ABNORMAL LOW (ref 3.87–5.11)
RDW: 12.7 % (ref 11.5–15.5)
WBC: 16.6 10*3/uL — ABNORMAL HIGH (ref 4.0–10.5)
nRBC: 0 % (ref 0.0–0.2)

## 2020-09-10 SURGERY — Surgical Case
Anesthesia: Spinal | Site: Abdomen

## 2020-09-10 MED ORDER — SODIUM CHLORIDE 0.9% FLUSH: 3.0000 mL | INTRAVENOUS | Status: DC | PRN

## 2020-09-10 MED ORDER — NALBUPHINE HCL 10 MG/ML IJ SOLN: 5.0000 mg | INTRAMUSCULAR | Status: DC | PRN

## 2020-09-10 MED ORDER — COCONUT OIL OIL
1.0000 "application " | TOPICAL_OIL | Status: DC | PRN
  Administered 2020-09-11: 1 via TOPICAL

## 2020-09-10 MED ORDER — PHENYLEPHRINE HCL-NACL 20-0.9 MG/250ML-% IV SOLN
INTRAVENOUS | Status: AC
  Filled 2020-09-10: qty 250

## 2020-09-10 MED ORDER — MISOPROSTOL 200 MCG PO TABS
800.0000 ug | ORAL_TABLET | Freq: Once | ORAL | Status: AC
  Administered 2020-09-10: 800 ug via RECTAL
  Filled 2020-09-10: qty 4

## 2020-09-10 MED ORDER — KETOROLAC TROMETHAMINE 30 MG/ML IJ SOLN: 30.0000 mg | Freq: Once | INTRAMUSCULAR | Status: DC | PRN

## 2020-09-10 MED ORDER — DIPHENHYDRAMINE HCL 25 MG PO CAPS: 25.0000 mg | ORAL_CAPSULE | ORAL | Status: DC | PRN

## 2020-09-10 MED ORDER — DIPHENHYDRAMINE HCL 50 MG/ML IJ SOLN: 12.5000 mg | INTRAMUSCULAR | Status: DC | PRN

## 2020-09-10 MED ORDER — DIBUCAINE (PERIANAL) 1 % EX OINT: 1.0000 "application " | TOPICAL_OINTMENT | CUTANEOUS | Status: DC | PRN

## 2020-09-10 MED ORDER — TETANUS-DIPHTH-ACELL PERTUSSIS 5-2.5-18.5 LF-MCG/0.5 IM SUSY: 0.5000 mL | PREFILLED_SYRINGE | Freq: Once | INTRAMUSCULAR | Status: DC

## 2020-09-10 MED ORDER — AZATHIOPRINE 50 MG PO TABS
50.0000 mg | ORAL_TABLET | Freq: Every day | ORAL | Status: DC
  Administered 2020-09-10: 50 mg via ORAL
  Filled 2020-09-10: qty 1

## 2020-09-10 MED ORDER — PHENYLEPHRINE HCL-NACL 20-0.9 MG/250ML-% IV SOLN
INTRAVENOUS | Status: DC | PRN
  Administered 2020-09-10: 30 ug/min via INTRAVENOUS

## 2020-09-10 MED ORDER — ONDANSETRON HCL 4 MG/2ML IJ SOLN: 4.0000 mg | Freq: Three times a day (TID) | INTRAMUSCULAR | Status: DC | PRN

## 2020-09-10 MED ORDER — ONDANSETRON HCL 4 MG/2ML IJ SOLN
INTRAMUSCULAR | Status: AC
  Filled 2020-09-10: qty 2

## 2020-09-10 MED ORDER — SCOPOLAMINE 1 MG/3DAYS TD PT72
1.0000 | MEDICATED_PATCH | Freq: Once | TRANSDERMAL | Status: DC
  Administered 2020-09-10: 1.5 mg via TRANSDERMAL

## 2020-09-10 MED ORDER — DIPHENHYDRAMINE HCL 25 MG PO CAPS: 25.0000 mg | ORAL_CAPSULE | Freq: Four times a day (QID) | ORAL | Status: DC | PRN

## 2020-09-10 MED ORDER — IBUPROFEN 800 MG PO TABS
800.0000 mg | ORAL_TABLET | Freq: Three times a day (TID) | ORAL | Status: DC
  Administered 2020-09-10 – 2020-09-12 (×5): 800 mg via ORAL
  Filled 2020-09-10 (×5): qty 1

## 2020-09-10 MED ORDER — OXYTOCIN-SODIUM CHLORIDE 30-0.9 UT/500ML-% IV SOLN
INTRAVENOUS | Status: DC | PRN
  Administered 2020-09-10: 300 mL via INTRAVENOUS

## 2020-09-10 MED ORDER — FENTANYL CITRATE (PF) 100 MCG/2ML IJ SOLN
INTRAMUSCULAR | Status: DC | PRN
  Administered 2020-09-10: 15 ug via INTRATHECAL

## 2020-09-10 MED ORDER — ACETAMINOPHEN 500 MG PO TABS
1000.0000 mg | ORAL_TABLET | Freq: Four times a day (QID) | ORAL | Status: DC
  Administered 2020-09-10 – 2020-09-12 (×7): 1000 mg via ORAL
  Filled 2020-09-10 (×7): qty 2

## 2020-09-10 MED ORDER — ACETAMINOPHEN 500 MG PO TABS: 1000.0000 mg | ORAL_TABLET | Freq: Four times a day (QID) | ORAL | Status: AC

## 2020-09-10 MED ORDER — TRANEXAMIC ACID 650 MG PO TABS
1300.0000 mg | ORAL_TABLET | Freq: Three times a day (TID) | ORAL | Status: DC | PRN
  Administered 2020-09-10: 1300 mg via ORAL
  Filled 2020-09-10 (×4): qty 2

## 2020-09-10 MED ORDER — KETOROLAC TROMETHAMINE 30 MG/ML IJ SOLN: 30.0000 mg | Freq: Four times a day (QID) | INTRAMUSCULAR | Status: AC | PRN

## 2020-09-10 MED ORDER — ACETAMINOPHEN 10 MG/ML IV SOLN
INTRAVENOUS | Status: AC
  Filled 2020-09-10: qty 100

## 2020-09-10 MED ORDER — ACETAMINOPHEN 10 MG/ML IV SOLN
1000.0000 mg | Freq: Once | INTRAVENOUS | Status: DC | PRN
  Administered 2020-09-10: 1000 mg via INTRAVENOUS

## 2020-09-10 MED ORDER — DEXAMETHASONE SODIUM PHOSPHATE 10 MG/ML IJ SOLN
INTRAMUSCULAR | Status: AC
  Filled 2020-09-10: qty 1

## 2020-09-10 MED ORDER — SENNOSIDES-DOCUSATE SODIUM 8.6-50 MG PO TABS
2.0000 | ORAL_TABLET | ORAL | Status: DC
  Administered 2020-09-10 – 2020-09-12 (×3): 2 via ORAL
  Filled 2020-09-10 (×3): qty 2

## 2020-09-10 MED ORDER — OXYTOCIN-SODIUM CHLORIDE 30-0.9 UT/500ML-% IV SOLN: 2.5000 [IU]/h | INTRAVENOUS | Status: AC

## 2020-09-10 MED ORDER — SODIUM CHLORIDE 0.9 % IR SOLN
Status: DC | PRN
  Administered 2020-09-10: 1000 mL

## 2020-09-10 MED ORDER — SIMETHICONE 80 MG PO CHEW: 80.0000 mg | CHEWABLE_TABLET | ORAL | Status: DC | PRN

## 2020-09-10 MED ORDER — ONDANSETRON HCL 4 MG/2ML IJ SOLN
INTRAMUSCULAR | Status: DC | PRN
  Administered 2020-09-10: 4 mg via INTRAVENOUS

## 2020-09-10 MED ORDER — LACTATED RINGERS IV SOLN: INTRAVENOUS | Status: DC

## 2020-09-10 MED ORDER — CERTOLIZUMAB PEGOL 2 X 200 MG ~~LOC~~ KIT: 400.0000 mg | PACK | SUBCUTANEOUS | Status: DC

## 2020-09-10 MED ORDER — MORPHINE SULFATE (PF) 0.5 MG/ML IJ SOLN
INTRAMUSCULAR | Status: AC
  Filled 2020-09-10: qty 10

## 2020-09-10 MED ORDER — NALOXONE HCL 0.4 MG/ML IJ SOLN: 0.4000 mg | INTRAMUSCULAR | Status: DC | PRN

## 2020-09-10 MED ORDER — OXYCODONE HCL 5 MG PO TABS
5.0000 mg | ORAL_TABLET | ORAL | Status: DC | PRN
  Filled 2020-09-10: qty 1

## 2020-09-10 MED ORDER — ZOLPIDEM TARTRATE 5 MG PO TABS
5.0000 mg | ORAL_TABLET | Freq: Every evening | ORAL | Status: DC | PRN
Start: 2020-09-10 — End: 2020-09-12

## 2020-09-10 MED ORDER — PRENATAL MULTIVITAMIN CH
1.0000 | ORAL_TABLET | Freq: Every day | ORAL | Status: DC
  Administered 2020-09-10 – 2020-09-11 (×2): 1 via ORAL
  Filled 2020-09-10 (×2): qty 1

## 2020-09-10 MED ORDER — KETOROLAC TROMETHAMINE 30 MG/ML IJ SOLN
INTRAMUSCULAR | Status: AC
  Filled 2020-09-10: qty 1

## 2020-09-10 MED ORDER — FENTANYL CITRATE (PF) 100 MCG/2ML IJ SOLN
INTRAMUSCULAR | Status: AC
  Filled 2020-09-10: qty 2

## 2020-09-10 MED ORDER — KETOROLAC TROMETHAMINE 30 MG/ML IJ SOLN
30.0000 mg | Freq: Four times a day (QID) | INTRAMUSCULAR | Status: AC | PRN
  Administered 2020-09-10: 30 mg via INTRAVENOUS

## 2020-09-10 MED ORDER — AZATHIOPRINE 50 MG PO TABS
100.0000 mg | ORAL_TABLET | Freq: Once | ORAL | Status: AC
  Administered 2020-09-10: 100 mg via ORAL
  Filled 2020-09-10: qty 2

## 2020-09-10 MED ORDER — NALBUPHINE HCL 10 MG/ML IJ SOLN: 5.0000 mg | Freq: Once | INTRAMUSCULAR | Status: DC | PRN

## 2020-09-10 MED ORDER — AZATHIOPRINE 50 MG PO TABS
150.0000 mg | ORAL_TABLET | Freq: Every day | ORAL | Status: DC
  Administered 2020-09-11 – 2020-09-12 (×2): 150 mg via ORAL
  Filled 2020-09-10 (×2): qty 3

## 2020-09-10 MED ORDER — SIMETHICONE 80 MG PO CHEW
80.0000 mg | CHEWABLE_TABLET | Freq: Three times a day (TID) | ORAL | Status: DC
  Administered 2020-09-10 – 2020-09-12 (×6): 80 mg via ORAL
  Filled 2020-09-10 (×5): qty 1

## 2020-09-10 MED ORDER — SCOPOLAMINE 1 MG/3DAYS TD PT72
MEDICATED_PATCH | TRANSDERMAL | Status: AC
  Filled 2020-09-10: qty 1

## 2020-09-10 MED ORDER — BUPIVACAINE IN DEXTROSE 0.75-8.25 % IT SOLN
INTRATHECAL | Status: DC | PRN
  Administered 2020-09-10: 1.6 mg via INTRATHECAL

## 2020-09-10 MED ORDER — POVIDONE-IODINE 10 % EX SWAB: 2.0000 "application " | Freq: Once | CUTANEOUS | Status: DC

## 2020-09-10 MED ORDER — DEXTROSE 5 % IV SOLN
1.0000 ug/kg/h | INTRAVENOUS | Status: DC | PRN
Start: 2020-09-10 — End: 2020-09-12
  Filled 2020-09-10: qty 5

## 2020-09-10 MED ORDER — WITCH HAZEL-GLYCERIN EX PADS: 1.0000 "application " | MEDICATED_PAD | CUTANEOUS | Status: DC | PRN

## 2020-09-10 MED ORDER — FENTANYL CITRATE (PF) 100 MCG/2ML IJ SOLN
25.0000 ug | INTRAMUSCULAR | Status: DC | PRN
Start: 2020-09-10 — End: 2020-09-10

## 2020-09-10 MED ORDER — CEFAZOLIN SODIUM-DEXTROSE 2-4 GM/100ML-% IV SOLN
INTRAVENOUS | Status: AC
  Filled 2020-09-10: qty 100

## 2020-09-10 MED ORDER — DEXAMETHASONE SODIUM PHOSPHATE 10 MG/ML IJ SOLN
INTRAMUSCULAR | Status: DC | PRN
  Administered 2020-09-10: 10 mg via INTRAVENOUS

## 2020-09-10 MED ORDER — MORPHINE SULFATE (PF) 0.5 MG/ML IJ SOLN
INTRAMUSCULAR | Status: DC | PRN
  Administered 2020-09-10: .15 mg via INTRATHECAL

## 2020-09-10 MED ORDER — OXYTOCIN-SODIUM CHLORIDE 30-0.9 UT/500ML-% IV SOLN
INTRAVENOUS | Status: AC
  Filled 2020-09-10: qty 500

## 2020-09-10 MED ORDER — MENTHOL 3 MG MT LOZG: 1.0000 | LOZENGE | OROMUCOSAL | Status: DC | PRN

## 2020-09-10 MED ORDER — SIMETHICONE 80 MG PO CHEW
80.0000 mg | CHEWABLE_TABLET | ORAL | Status: DC
  Filled 2020-09-10: qty 1

## 2020-09-10 MED ORDER — CEFAZOLIN SODIUM-DEXTROSE 2-4 GM/100ML-% IV SOLN
2.0000 g | INTRAVENOUS | Status: AC
  Administered 2020-09-10: 2 g via INTRAVENOUS

## 2020-09-10 MED ORDER — STERILE WATER FOR IRRIGATION IR SOLN
Status: DC | PRN
  Administered 2020-09-10: 1000 mL

## 2020-09-10 SURGICAL SUPPLY — 36 items
APL SKNCLS STERI-STRIP NONHPOA (GAUZE/BANDAGES/DRESSINGS) ×1
BENZOIN TINCTURE PRP APPL 2/3 (GAUZE/BANDAGES/DRESSINGS) ×1 IMPLANT
CHLORAPREP W/TINT 26ML (MISCELLANEOUS) ×2 IMPLANT
CLAMP CORD UMBIL (MISCELLANEOUS) IMPLANT
CLOSURE STERI STRIP 1/2 X4 (GAUZE/BANDAGES/DRESSINGS) ×1 IMPLANT
CLOTH BEACON ORANGE TIMEOUT ST (SAFETY) ×2 IMPLANT
DRSG OPSITE POSTOP 4X10 (GAUZE/BANDAGES/DRESSINGS) ×2 IMPLANT
ELECT REM PT RETURN 9FT ADLT (ELECTROSURGICAL) ×2
ELECTRODE REM PT RTRN 9FT ADLT (ELECTROSURGICAL) ×1 IMPLANT
EXTRACTOR VACUUM KIWI (MISCELLANEOUS) IMPLANT
GLOVE BIO SURGEON STRL SZ 6.5 (GLOVE) ×2 IMPLANT
GLOVE BIOGEL PI IND STRL 7.0 (GLOVE) ×1 IMPLANT
GLOVE BIOGEL PI INDICATOR 7.0 (GLOVE) ×1
GOWN STRL REUS W/TWL LRG LVL3 (GOWN DISPOSABLE) ×4 IMPLANT
KIT ABG SYR 3ML LUER SLIP (SYRINGE) IMPLANT
NDL HYPO 25X5/8 SAFETYGLIDE (NEEDLE) IMPLANT
NEEDLE HYPO 25X5/8 SAFETYGLIDE (NEEDLE) IMPLANT
NS IRRIG 1000ML POUR BTL (IV SOLUTION) ×2 IMPLANT
PACK C SECTION WH (CUSTOM PROCEDURE TRAY) ×2 IMPLANT
PAD OB MATERNITY 4.3X12.25 (PERSONAL CARE ITEMS) ×2 IMPLANT
PENCIL SMOKE EVAC W/HOLSTER (ELECTROSURGICAL) ×2 IMPLANT
RTRCTR C-SECT PINK 25CM LRG (MISCELLANEOUS) ×2 IMPLANT
STRIP CLOSURE SKIN 1/2X4 (GAUZE/BANDAGES/DRESSINGS) IMPLANT
SUT CHROMIC 1 CTX 36 (SUTURE) ×4 IMPLANT
SUT PLAIN 0 NONE (SUTURE) IMPLANT
SUT PLAIN 2 0 XLH (SUTURE) ×2 IMPLANT
SUT VIC AB 0 CT1 27 (SUTURE) ×4
SUT VIC AB 0 CT1 27XBRD ANBCTR (SUTURE) ×2 IMPLANT
SUT VIC AB 2-0 CT1 27 (SUTURE) ×4
SUT VIC AB 2-0 CT1 TAPERPNT 27 (SUTURE) ×1 IMPLANT
SUT VIC AB 3-0 CT1 27 (SUTURE)
SUT VIC AB 3-0 CT1 TAPERPNT 27 (SUTURE) IMPLANT
SUT VIC AB 4-0 KS 27 (SUTURE) ×2 IMPLANT
TOWEL OR 17X24 6PK STRL BLUE (TOWEL DISPOSABLE) ×2 IMPLANT
TRAY FOLEY W/BAG SLVR 14FR LF (SET/KITS/TRAYS/PACK) ×2 IMPLANT
WATER STERILE IRR 1000ML POUR (IV SOLUTION) ×2 IMPLANT

## 2020-09-10 NOTE — Anesthesia Procedure Notes (Signed)
Spinal  Patient location during procedure: OR Start time: 09/10/2020 7:20 AM End time: 09/10/2020 7:25 AM Staffing Performed: anesthesiologist  Anesthesiologist: Freddrick March, MD Preanesthetic Checklist Completed: patient identified, IV checked, risks and benefits discussed, surgical consent, monitors and equipment checked, pre-op evaluation and timeout performed Spinal Block Patient position: sitting Prep: DuraPrep and site prepped and draped Patient monitoring: cardiac monitor, continuous pulse ox and blood pressure Approach: midline Location: L3-4 Injection technique: single-shot Needle Needle type: Pencan  Needle gauge: 24 G Needle length: 9 cm Assessment Sensory level: T6 Additional Notes Functioning IV was confirmed and monitors were applied. Sterile prep and drape, including hand hygiene and sterile gloves were used. The patient was positioned and the spine was prepped. The skin was anesthetized with lidocaine.  Free flow of clear CSF was obtained prior to injecting local anesthetic into the CSF.  The spinal needle aspirated freely following injection.  The needle was carefully withdrawn.  The patient tolerated the procedure well.

## 2020-09-10 NOTE — Anesthesia Preprocedure Evaluation (Signed)
Anesthesia Evaluation  Patient identified by MRN, date of birth, ID band Patient awake    Reviewed: Allergy & Precautions, NPO status , Patient's Chart, lab work & pertinent test results  Airway Mallampati: II  TM Distance: >3 FB Neck ROM: Full    Dental no notable dental hx.    Pulmonary asthma ,    Pulmonary exam normal breath sounds clear to auscultation       Cardiovascular negative cardio ROS Normal cardiovascular exam Rhythm:Regular Rate:Normal     Neuro/Psych PSYCHIATRIC DISORDERS Anxiety negative neurological ROS     GI/Hepatic negative GI ROS, Neg liver ROS, H/o Crohn's   Endo/Other  negative endocrine ROS  Renal/GU negative Renal ROS  negative genitourinary   Musculoskeletal negative musculoskeletal ROS (+)   Abdominal   Peds  Hematology negative hematology ROS (+)   Anesthesia Other Findings Primary C/S for Crohn's c/b rectal/perineal fistula  Reproductive/Obstetrics (+) Pregnancy                             Anesthesia Physical Anesthesia Plan  ASA: II  Anesthesia Plan: Spinal   Post-op Pain Management:    Induction:   PONV Risk Score and Plan: Treatment may vary due to age or medical condition  Airway Management Planned: Natural Airway  Additional Equipment:   Intra-op Plan:   Post-operative Plan:   Informed Consent: I have reviewed the patients History and Physical, chart, labs and discussed the procedure including the risks, benefits and alternatives for the proposed anesthesia with the patient or authorized representative who has indicated his/her understanding and acceptance.     Dental advisory given  Plan Discussed with: CRNA  Anesthesia Plan Comments:         Anesthesia Quick Evaluation

## 2020-09-10 NOTE — Interval H&P Note (Signed)
History and Physical Interval Note:  09/10/2020 7:09 AM  Alexandria Garcia  has presented today for surgery, with the diagnosis of primary C-Section.  The various methods of treatment have been discussed with the patient and family. After consideration of risks, benefits and other options for treatment, the patient has consented to  Procedure(s) with comments: CESAREAN SECTION (N/A) - request RNFA as a surgical intervention.  The patient's history has been reviewed, patient examined, no change in status, stable for surgery.  I have reviewed the patient's chart and labs.  Questions were answered to the patient's satisfaction.     Logan Bores

## 2020-09-10 NOTE — Transfer of Care (Signed)
Immediate Anesthesia Transfer of Care Note  Patient: Alexandria Garcia  Procedure(s) Performed: CESAREAN SECTION (N/A Abdomen)  Patient Location: PACU  Anesthesia Type:Spinal  Level of Consciousness: awake  Airway & Oxygen Therapy: Patient Spontanous Breathing  Post-op Assessment: Report given to RN  Post vital signs: Reviewed and stable  Last Vitals:  Vitals Value Taken Time  BP 117/80 09/10/20 0841  Temp    Pulse 84 09/10/20 0844  Resp 21 09/10/20 0844  SpO2 100 % 09/10/20 0844  Vitals shown include unvalidated device data.  Last Pain:  Vitals:   09/10/20 0610  TempSrc: Oral  PainSc: 0-No pain         Complications: No complications documented.

## 2020-09-10 NOTE — Anesthesia Postprocedure Evaluation (Signed)
Anesthesia Post Note  Patient: Alexandria Garcia  Procedure(s) Performed: CESAREAN SECTION (N/A Abdomen)     Patient location during evaluation: PACU Anesthesia Type: Spinal Level of consciousness: oriented and awake and alert Pain management: pain level controlled Vital Signs Assessment: post-procedure vital signs reviewed and stable Respiratory status: spontaneous breathing, respiratory function stable and patient connected to nasal cannula oxygen Cardiovascular status: blood pressure returned to baseline and stable Postop Assessment: no headache, no backache and no apparent nausea or vomiting Anesthetic complications: no   No complications documented.  Last Vitals:  Vitals:   09/10/20 0945 09/10/20 0955  BP: 121/72 119/89  Pulse: 61 61  Resp: 17 16  Temp:  36.6 C  SpO2: 99% 100%    Last Pain:  Vitals:   09/10/20 0955  TempSrc: Oral  PainSc: 0-No pain   Pain Goal:                   Rhilynn Preyer L Quinta Eimer

## 2020-09-10 NOTE — Progress Notes (Addendum)
Patient ID: Alexandria Garcia, female   DOB: 01-22-1986, 34 y.o.   MRN: [id number] Pt stable. Pad last changed an hour ago - minimal bleeding only on it. Fundus firm on exam; at umbilicus. Small - moderate bleeding only noted on fundal rub; no clots. On sve - cervix 1cm, dil; no clots.   Hg dropped from 11.6 to 10.9 only   Plan: Will give one dose of TXA to effect further bleeding control; may receive TID if needed ( not to exceed 5 days)          Routine pp/post op care

## 2020-09-10 NOTE — Op Note (Addendum)
Operative Note    Preoperative Diagnosis Term pregnancy at 16 3/7 weeks  Crohn's disease with chronic perineal/rectal fistula Fibroid uterus  Postoperative Diagnosis same  Procedure Primary low transverse c-section with 2 layer closure of uterus  Surgeon Paula Compton, MD  Anesthesia Spinal  Fluids: EBL 416mL UOP 371mL IVF 1943mL  Findings Viable female infant in the vertex presentation.  Apgars 8,9 Weight pending.  Uterus enlarged with multiple fibroids, largest posterior fundal 7cm,  Normal ovaries and tubes  Specimen Placenta to L&D  Procedure Note Patient was taken to the operating room where spinal anesthesia was obtaines and  found to be adequate by Allis clamp test. She was prepped and draped in the normal sterile fashion in the dorsal supine position with a leftward tilt. An appropriate time out was performed. A Pfannenstiel skin incision was then made with the scalpel and carried through to the underlying layer of fascia by sharp dissection and Bovie cautery. The fascia was nicked in the midline and the incision was extended laterally with Mayo scissors. The inferior aspect of the incision was grasped Coker clamps and dissected off the underlying rectus muscles. In a similar fashion the superior aspect was dissected off the rectus muscles. Rectus muscles were separated in the midline and the peritoneal cavity entered bluntly. The peritoneal incision was then extended both superiorly and inferiorly with careful attention to avoid both bowel and bladder. The Alexis self-retaining wound retractor was then placed within the incision and the lower uterine segment exposed. The bladder flap was developed with Metzenbaum scissors and pushed away from the lower uterine segment. The lower uterine segment was then incised in a transverse fashion and the cavity itself entered bluntly. The incision was extended bluntly. The infant's head was then lifted and delivered from the incision  without difficulty. The remainder of the infant delivered and the nose and mouth bulb suctioned with the cord clamped and cut as well. The infant was handed off to the waiting pediatricians. The placenta was then spontaneously expressed from the uterus and the uterus cleared of all clots and debris with moist lap sponge. The uterine incision was then repaired in 2 layers the first layer was a running locked layer 1-0 chromic and the second an imbricating layer of the same suture. The tubes and ovaries were inspected and the gutters cleared of all clots and debris. The uterine incision was inspected and found to be hemostatic. All instruments and sponges as well as the Alexis retractor were then removed from the abdomen. The rectus muscles and peritoneum were then reapproximated with several interrupted mattress sutures of 2-0 Vicryl. The fascia was then closed with 0 Vicryl in a running fashion. Subcutaneous tissue was reapproximated with 3-0 plain in a running fashion. The skin was closed with a subcuticular stitch of 4-0 Vicryl on a Keith needle and then reinforced with benzoin and Steri-Strips. At the conclusion of the procedure all instruments and sponge counts were correct. Patient was taken to the recovery room in good condition with her baby accompanying her skin to skin.  D/w pt and husband circumcision and they desire to proceed in hospital.

## 2020-09-10 NOTE — Progress Notes (Signed)
Patient is scheduled to have 150mg  of Imuran daily. Patient only received 50mg  during day shift.There is an order in for 150mg  set to start on 09/11/2020, but patient still needs 100mg  to complete total for 09/10/2020. Contacted Dr. Terri Piedra for verbal order for one time dose of 100mg  of Imuran to complete patients scheduled dose of 150mg  for today.

## 2020-09-10 NOTE — Progress Notes (Signed)
Patient ID: Alexandria Garcia, female   DOB: 09-23-1986, 34 y.o.   MRN: [id number] Updated dose of imuran on pt chart from 50mg  to 150mg  po daily per pt report

## 2020-09-10 NOTE — Lactation Note (Signed)
This note was copied from a baby's chart. Lactation Consultation Note  Patient Name: Alexandria Garcia Today's Date: 09/10/2020 Reason for consult: Initial assessment;Term;Primapara;1st time breastfeeding   P1 mother whose infant is now 55 hours old.  This is a term baby at 39+3 weeks.  Baby was swaddled and asleep when I arrived.  Mother had no immediate questions/concerns related to breast feeding stating that her son has fed four times since delivery.  Encouraged feeding 8-12 times/24 hours or sooner if baby shows feeding cues.  Reviewed cues with mother.  M/B student reviewed hand expression.  Colostrum container provided and milk storage times discussed.  Finger feeding demonstrated.  Suggested mother call her RN/LC for latch assistance as needed.  Mom made aware of O/P services, breastfeeding support groups, community resources, and our phone # for post-discharge questions.  Father present.  Mother has a DEBP for home use.    Maternal Data Formula Feeding for Exclusion: No Has patient been taught Hand Expression?: Yes Does the patient have breastfeeding experience prior to this delivery?: No  Feeding Feeding Type: Breast Milk  LATCH Score                   Interventions    Lactation Tools Discussed/Used WIC Program: No   Consult Status Consult Status: Follow-up Date: 09/11/20 Follow-up type: In-patient    Mele Sylvester R Glorie Dowlen 09/10/2020, 1:13 PM

## 2020-09-10 NOTE — Progress Notes (Signed)
[name] was referred for history of depression/anxiety. * Referral screened out by Clinical Social Worker because none of the following criteria appear to apply: ~ History of anxiety/depression during this pregnancy, or of post-partum depression following prior delivery. ~ Diagnosis of anxiety and/or depression within last 3 years OR * [name]'s symptoms currently being treated with medication and/or therapy. Per further chart review, it is noted that [name] is on Zoloft for her anxiety.    Please contact the Clinical Social Worker if needs arise, by [facility] request, or if [name] scores greater than 9/yes to question 10 on Edinburgh Postpartum Depression Screen.   Alexandria Garcia, MSW, LCSW [facility] [place] [phone]

## 2020-09-11 LAB — CBC
HCT: 26 % — ABNORMAL LOW (ref 36.0–46.0)
Hemoglobin: 8.6 g/dL — ABNORMAL LOW (ref 12.0–15.0)
MCH: 29.4 pg (ref 26.0–34.0)
MCHC: 33.1 g/dL (ref 30.0–36.0)
MCV: 88.7 fL (ref 80.0–100.0)
Platelets: 192 10*3/uL (ref 150–400)
RBC: 2.93 MIL/uL — ABNORMAL LOW (ref 3.87–5.11)
RDW: 12.4 % (ref 11.5–15.5)
WBC: 13.2 10*3/uL — ABNORMAL HIGH (ref 4.0–10.5)
nRBC: 0 % (ref 0.0–0.2)

## 2020-09-11 LAB — BIRTH TISSUE RECOVERY COLLECTION (PLACENTA DONATION)

## 2020-09-11 NOTE — Lactation Note (Signed)
This note was copied from a baby's chart. Lactation Consultation Note  Patient Name: Alexandria Garcia TGAID'K Date: 09/11/2020 Reason for consult: Follow-up assessment  P1 mother whose infant is now 55 hours old.  This is a term baby at 39+3 weeks.  Baby was asleep in mother's lap when I arrived.  Mother had no questions/concerns related to breast feeding.  She feels like baby is latching well.  He has had multiple voids/stools. Mother will continue to practice hand expression before/after feedings.  RN provided manual pump today.    Mother will call as needed for latch assistance.  She has a DEBP for home use.    Maternal Data    Feeding Feeding Type: Breast Fed  LATCH Score Latch: Grasps breast easily, tongue down, lips flanged, rhythmical sucking.  Audible Swallowing: A few with stimulation  Type of Nipple: Everted at rest and after stimulation  Comfort (Breast/Nipple): Soft / non-tender  Hold (Positioning): Assistance needed to correctly position infant at breast and maintain latch.  LATCH Score: 8  Interventions    Lactation Tools Discussed/Used     Consult Status Consult Status: Follow-up Date: 09/12/20 Follow-up type: In-patient    [name] 09/11/2020, 4:36 PM

## 2020-09-11 NOTE — Progress Notes (Signed)
POSTPARTUM POSTOP PROGRESS NOTE  POD #1  Subjective:  Patient is s/p Foley and void, states lochia was mdoerate this AM and did note small clots but did not visualize or quantify, no notification from RN this AM. Baby doing very well and breastfeeding during exam. Pain overall well controlled, feels slight tugging on left side of incision; reviewed layer closures and that occasionally where knot was laid down may feel tight initially but should resolve with ambulation. Has ambulated without dizziness, lightheadedness, SOB, palpitations  Objective: Blood pressure 103/61, pulse (!) 57, temperature 98.4 F (36.9 C), temperature source Oral, resp. rate 16, height 5\' 3"  (1.6 m), weight 87.2 kg, last menstrual period 12/09/2019, SpO2 100 %, unknown if currently breastfeeding.  Physical Exam:  General: alert, cooperative and no distress Lochia:normal flow Chest: CTAB Heart: RRR no m/r/g Abdomen: +BS, soft, nontender Uterine Fundus: firm, 1cm below umbilicus, irr contour c/w fibroids. Honeycomb dressing intact, neg drainage. Mild TTP on left apex fo incision however CDI, no palpable suture noted Extremities: neg edema, neg calf TTP BL, neg Homans BL  Recent Labs    09/10/20 1150 09/11/20 0540  HGB 10.9* 8.6*  HCT 32.2* 26.0*    Assessment/Plan:  ASSESSMENT: Alexandria Garcia is a 34 y.o. G1P1001 s/p PLTCS @ [redacted]w[redacted]d for chronic perineal/rectal fistula. PNC c/b Crohns, fibroid uterus.   Patient desires circumcision for baby boy today, will schedule Anemia 2/2 blood loss: pt s/p 1g TXA plus PR cytotec yesterday afternoon, asymptomatic from anemia at this time. Continue to monitor lochia; fibroid uterus may also play part in increased lochia postoperatively   LOS: 1 day

## 2020-09-12 MED ORDER — IBUPROFEN 800 MG PO TABS: 800.0000 mg | ORAL_TABLET | Freq: Three times a day (TID) | ORAL | 0 refills | Status: AC

## 2020-09-12 MED ORDER — ACETAMINOPHEN 500 MG PO TABS: 1000.0000 mg | ORAL_TABLET | Freq: Four times a day (QID) | ORAL | 0 refills | Status: AC

## 2020-09-12 NOTE — Discharge Summary (Signed)
Postpartum Discharge Summary       Patient Name: Alexandria Garcia DOB: Jun 11, 1986 MRN: [id number]  Date of admission: 09/10/2020 Delivery date:09/10/2020  Delivering provider: Paula Compton  Date of discharge: 09/12/2020  Admitting diagnosis: S/P primary low transverse C-section [Z98.891] Intrauterine pregnancy: [redacted]w[redacted]d    Secondary diagnosis:  Active Problems:   S/P primary low transverse C-section Bulky fibroid uterus   Additional problems: Crohn's disease with chronic rectal/perineal fistula    Discharge diagnosis: Term Pregnancy Delivered                                              Post partum procedures:none  Complications: None  Hospital course: Sceduled C/S   34y.o. yo G1P1001 at 34w3das admitted to the hospital 09/10/2020 for scheduled cesarean section with the following indication:Crohn's disease with perineal fistual.Delivery details are as follows:  Membrane Rupture Time/Date: 7:49 AM ,09/10/2020   Delivery Method:C-Section, Low Transverse  Details of operation can be found in separate operative note.  Patient had an uncomplicated postpartum course.  She is ambulating, tolerating a regular diet, passing flatus, and urinating well. Patient is discharged home in stable condition on  09/12/20        Newborn Data: Birth date:09/10/2020  Birth time:7:51 AM  Gender:Female  Living status:Living  Apgars:8 ,9  Weight:4670 g     Magnesium Sulfate received: No BMZ received: No Rhophylac:No   Physical exam  Vitals:   09/11/20 0225 09/11/20 1500 09/11/20 2254 09/12/20 0624  BP: 103/61 115/72 116/69 124/71  Pulse: (!) 57 69 65 68  Resp: '16 18 17 16  ' Temp: 98.4 F (36.9 C) 98.3 F (36.8 C) 98.4 F (36.9 C) 98.2 F (36.8 C)  TempSrc: Oral Oral Oral Oral  SpO2: 100%  100% 99%  Weight:      Height:       General: alert and cooperative Lochia: appropriate Uterine Fundus: firm Incision: Dressing is clean, dry, and intact DVT Evaluation: No evidence of  DVT seen on physical exam. Labs: Lab Results  Component Value Date   WBC 13.2 (H) 09/11/2020   HGB 8.6 (L) 09/11/2020   HCT 26.0 (L) 09/11/2020   MCV 88.7 09/11/2020   PLT 192 09/11/2020   CMP Latest Ref Rng & Units 03/23/2020  Glucose 70 - 99 mg/dL 97  BUN 6 - 20 mg/dL 9  Creatinine 0.44 - 1.00 mg/dL 0.68  Sodium 135 - 145 mmol/L 137  Potassium 3.5 - 5.1 mmol/L 3.7  Chloride 98 - 111 mmol/L 105  CO2 22 - 32 mmol/L 22  Calcium 8.9 - 10.3 mg/dL 8.7(L)  Total Protein 6.5 - 8.1 g/dL 6.7  Total Bilirubin 0.3 - 1.2 mg/dL 0.9  Alkaline Phos 38 - 126 U/L 29(L)  AST 15 - 41 U/L 15  ALT 0 - 44 U/L 10   Edinburgh Score: Edinburgh Postnatal Depression Scale Screening Tool 09/11/2020  I have been able to laugh and see the funny side of things. 0  I have looked forward with enjoyment to things. 0  I have blamed myself unnecessarily when things went wrong. 0  I have been anxious or worried for no good reason. 1  I have felt scared or panicky for no good reason. 0  Things have been getting on top of me. 0  I have been so unhappy that I have had difficulty  sleeping. 0  I have felt sad or miserable. 0  I have been so unhappy that I have been crying. 0  The thought of harming myself has occurred to me. 0  Edinburgh Postnatal Depression Scale Total 1     After visit meds:  Allergies as of 09/12/2020      Reactions   Infliximab Other (See Comments), Anaphylaxis   RASH ON FACE AND ARMS AFTER 50 CC INFUSION RASH ON FACE AND ARMS AFTER 50 CC INFUSION Other reaction(s): Other (See Comments) RASH ON FACE AND ARMS AFTER 50 CC INFUSION      Medication List    STOP taking these medications   ondansetron 8 MG disintegrating tablet Commonly known as: Zofran ODT   oxyCODONE 5 MG immediate release tablet Commonly known as: Roxicodone   progesterone 100 MG capsule Commonly known as: PROMETRIUM     TAKE these medications   acetaminophen 500 MG tablet Commonly known as: TYLENOL Take 2  tablets (1,000 mg total) by mouth every 6 (six) hours.   azaTHIOprine 50 MG tablet Commonly known as: IMURAN Take 50 mg by mouth daily.   Cimzia 2 X 200 MG Kit Generic drug: Certolizumab Pegol Inject 400 mg into the skin every 14 (fourteen) days.   ibuprofen 800 MG tablet Commonly known as: ADVIL Take 1 tablet (800 mg total) by mouth every 8 (eight) hours.   prenatal multivitamin Tabs tablet Take 1 tablet by mouth daily at 12 noon.        Discharge home in stable condition Infant Feeding: Breast Infant Disposition:home with mother Discharge instruction: per After Visit Summary and Postpartum booklet. Activity: Advance as tolerated. Pelvic rest for 6 weeks.  Diet: routine diet Future Appointments:No future appointments. Follow up Visit:  Follow-up Information    Paula Compton, MD. Schedule an appointment as soon as possible for a visit in 2 week(s).   Specialty: Obstetrics and Gynecology Why: incision check Contact information: [address] [place] [address] [phone]                Please schedule this patient for a In person postpartum visit in 2 weeks with the following provider: MD.  Delivery mode:  C-Section, Low Transverse  Anticipated Birth Control:  Unsure   09/12/2020 Logan Bores, MD

## 2020-09-12 NOTE — Lactation Note (Signed)
This note was copied from a baby's chart. Lactation Consultation Note  Patient Name: Alexandria Garcia [id number] Date: 09/12/2020 Reason for consult: Follow-up assessment   Baby 40 hours old and sleeping in FOB's arms. Stools transitioning to green and mother's breasts are filling. Feed on demand with cues.  Goal 8-12+ times per day after first 24 hrs.  Place baby STS if not cueing.  Reviewed engorgement care and monitoring voids/stools.    Maternal Data    Feeding Feeding Type: Breast Fed  LATCH Score                   Interventions Interventions: Breast feeding basics reviewed  Lactation Tools Discussed/Used     Consult Status Consult Status: Complete Date: 09/12/20    Vivianne Master [facility] 09/12/2020, 9:27 AM

## 2020-09-12 NOTE — Progress Notes (Signed)
Subjective: Postpartum Day 2: Cesarean Delivery Patient reports incisional pain, tolerating PO, + BM and no problems voiding.  Bleeding has been no heavier than a normal menses.  Ambulating fine  Objective: Vital signs in last 24 hours: Temp:  [98.2 F (36.8 C)-98.4 F (36.9 C)] 98.2 F (36.8 C) (12/15 0624) Pulse Rate:  [65-69] 68 (12/15 0624) Resp:  [16-18] 16 (12/15 0624) BP: (115-124)/(69-72) 124/71 (12/15 0624) SpO2:  [99 %-100 %] 99 % (12/15 1552)  Physical Exam:  General: alert and cooperative Lochia: appropriate Uterine Fundus: firm--umbilicus +0EYEM fibroids Incision:C/D/I DVT Evaluation: No evidence of DVT seen on physical exam.  Recent Labs    09/10/20 1150 09/11/20 0540  HGB 10.9* 8.6*  HCT 32.2* 26.0*    Assessment/Plan: Status post Cesarean section. Doing well postoperatively. Tolerating her blood loss anemia well--will start po iron at home. Discharge home with standard precautions and return to clinic in 2 weeks.  Alexandria Garcia 09/12/2020, 10:04 AM

## 2020-11-23 IMAGING — US US RENAL
1 series · 15 of 25 positions shown · non-contrast
Comparison: None.

CLINICAL DATA: Right-sided abdominal pain

EXAM:
RENAL / URINARY TRACT ULTRASOUND COMPLETE

[Series 1: us renal · 15 of 48 slices shown]
[im 1/48]
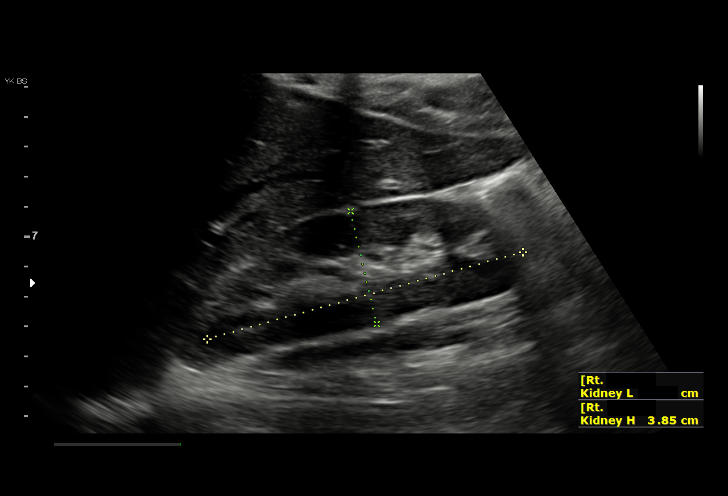
[im 4/48]
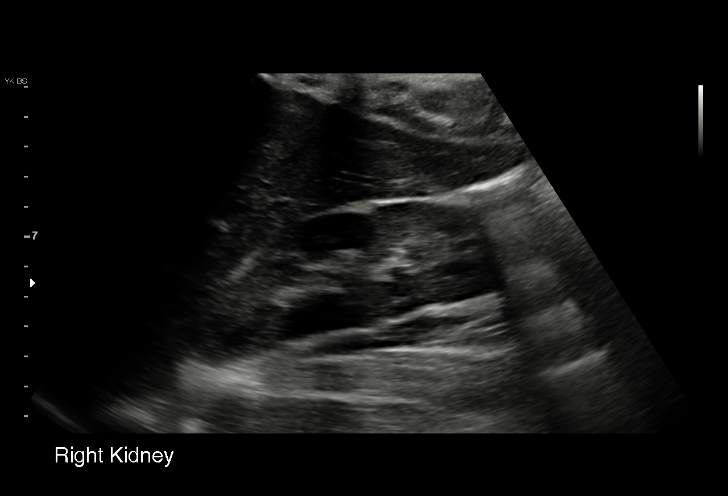
[im 8/48]
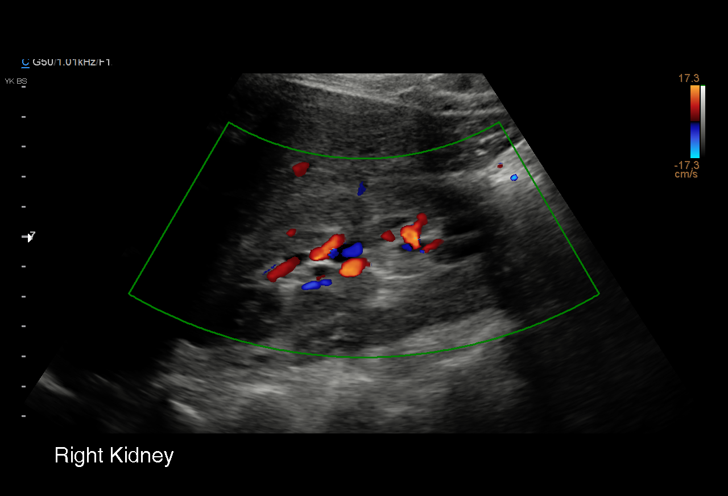
[im 10/48]
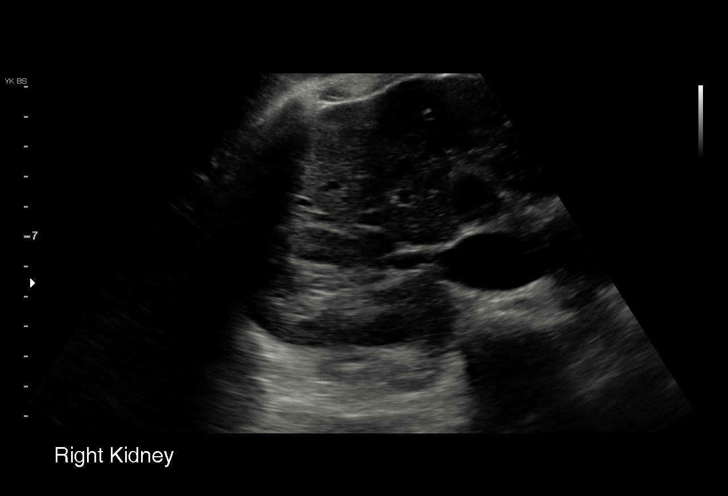
[im 14/48]
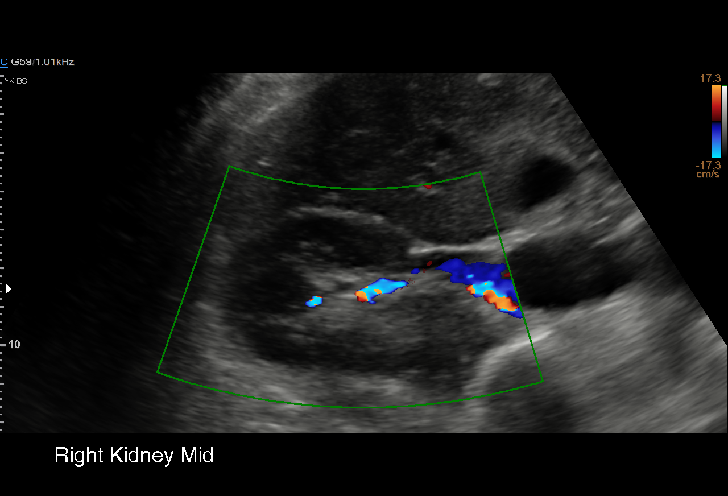
[im 18/48]
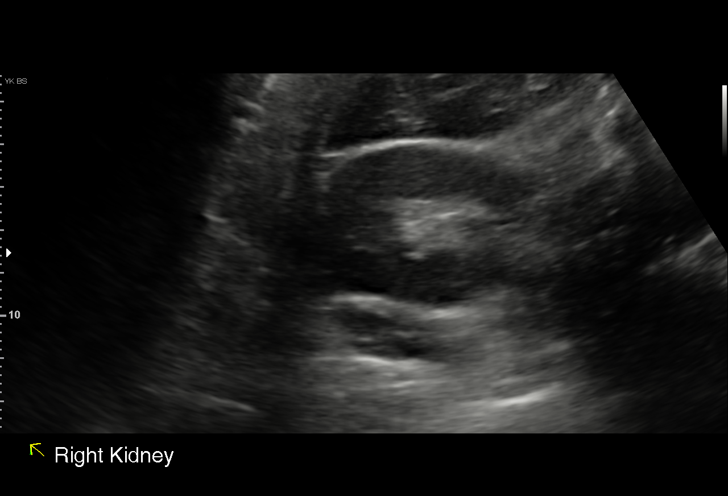
[im 20/48]
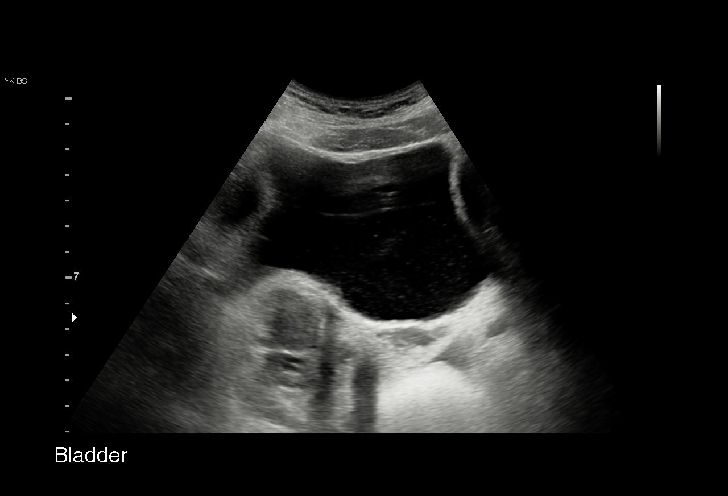
[im 24/48]
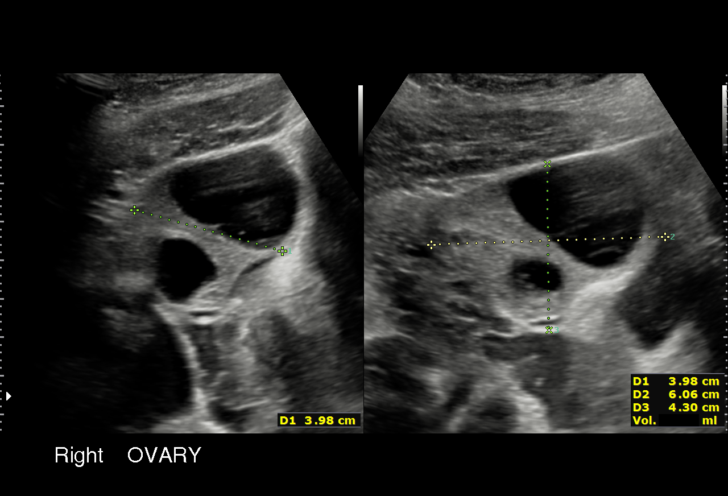
[im 28/48]
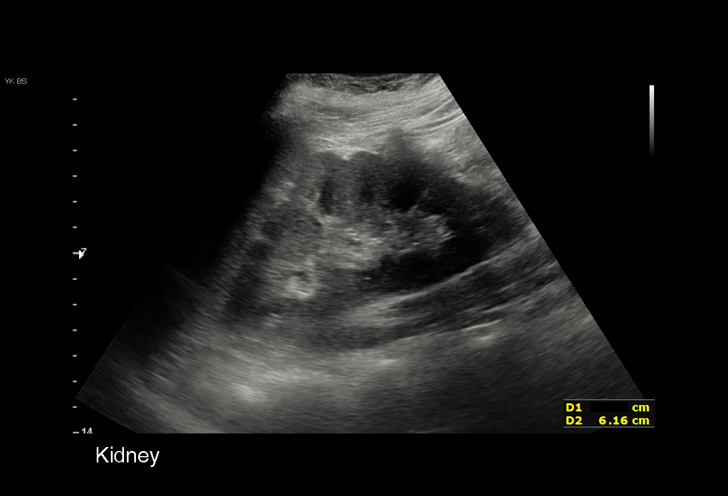
[im 30/48]
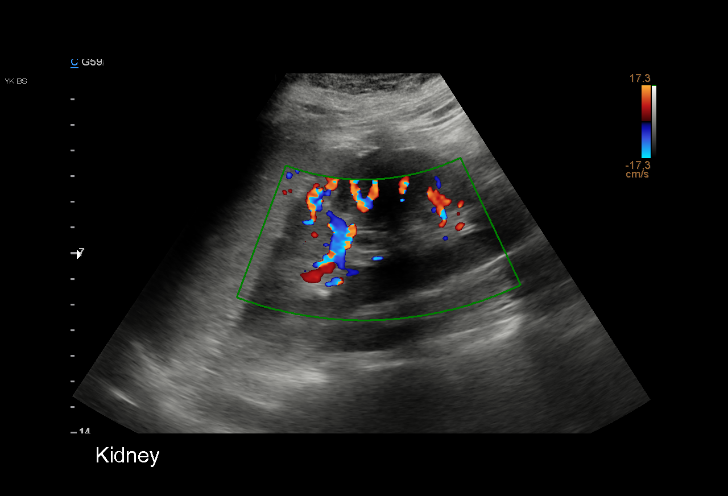
[im 34/48]
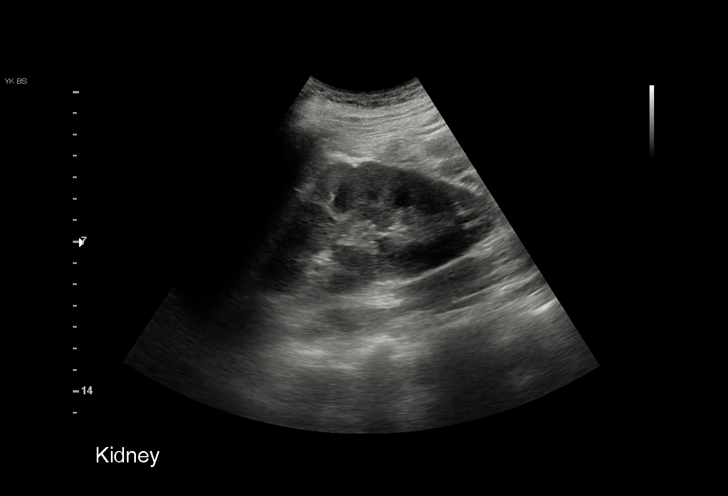
[im 38/48]
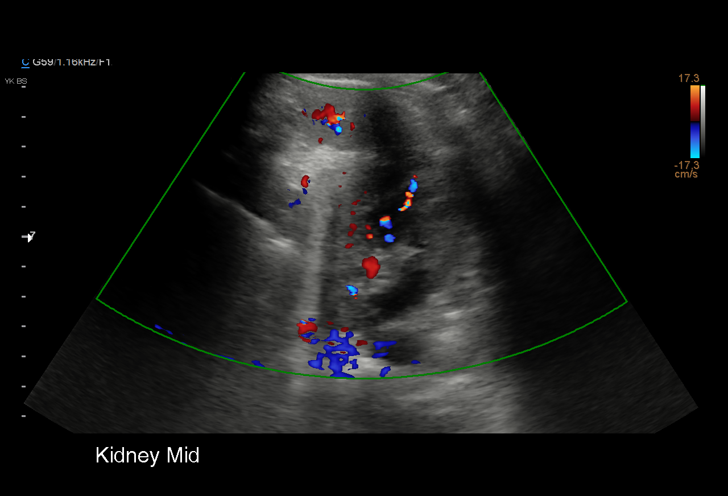
[im 40/48]
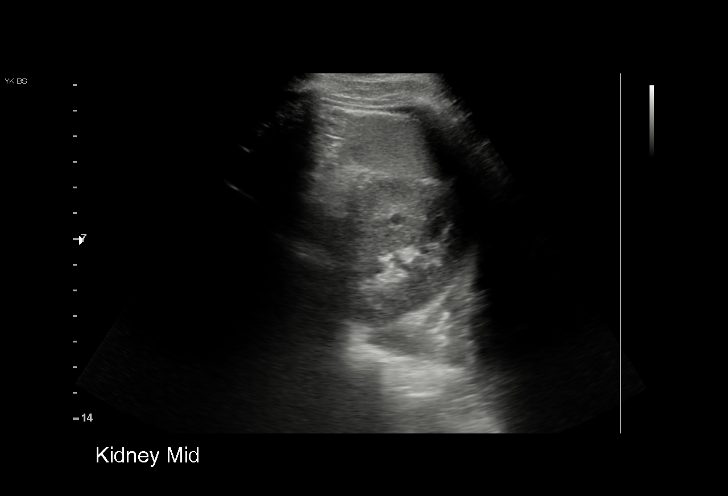
[im 44/48]
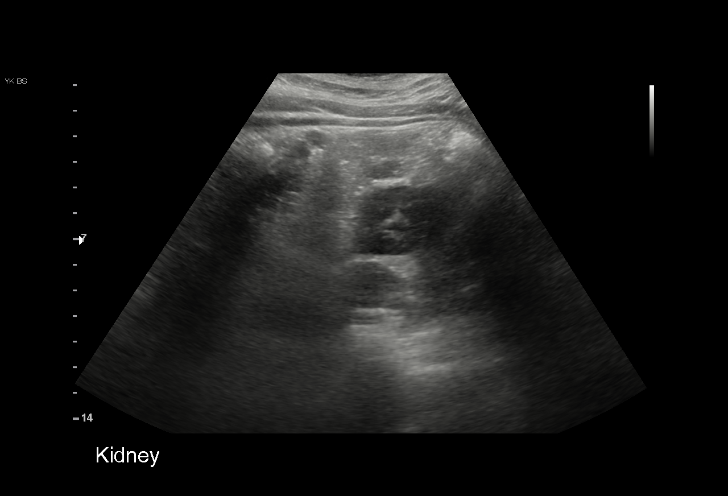
[im 48/48]
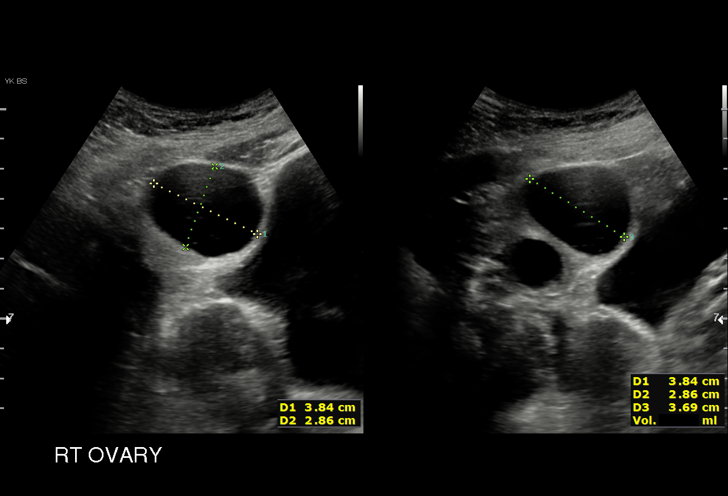

[15 of 25 positions shown; findings below may reference images not displayed]

FINDINGS: Right Kidney:

Renal measurements: 11.0 x 3.8 x 5.5 cm. = volume: 121 mL .
Echogenicity within normal limits. No mass or hydronephrosis
visualized.

Left Kidney:

Renal measurements: 11.3 x 6.2 x 4.4 cm. = volume: 154 mL.
Echogenicity within normal limits. No mass or hydronephrosis
visualized.

Bladder:

Appears normal for degree of bladder distention.

Other:

Note is made of ovarian cystic change. The largest of these measures
3.7 cm. Gravid uterus is also noted.
IMPRESSION: Normal-appearing kidneys.

Right ovarian cystic change

Changes consistent gravid uterus.

## 2021-01-26 IMAGING — US US BREAST*R* LIMITED INC AXILLA
1 series · 6 of 6 positions shown · non-contrast
Comparison: RIGHT breast ultrasound dated 04/12/2020.

CLINICAL DATA: Follow-up for probably benign complicated cyst
within the upper-outer RIGHT breast. Recent history of RIGHT breast
mastitis.

EXAM:
ULTRASOUND OF THE RIGHT BREAST

[Series 1: us breast*right* limited inc axilla · 0.05mm/px · 6 of 6 slices shown]
[im 1/6]
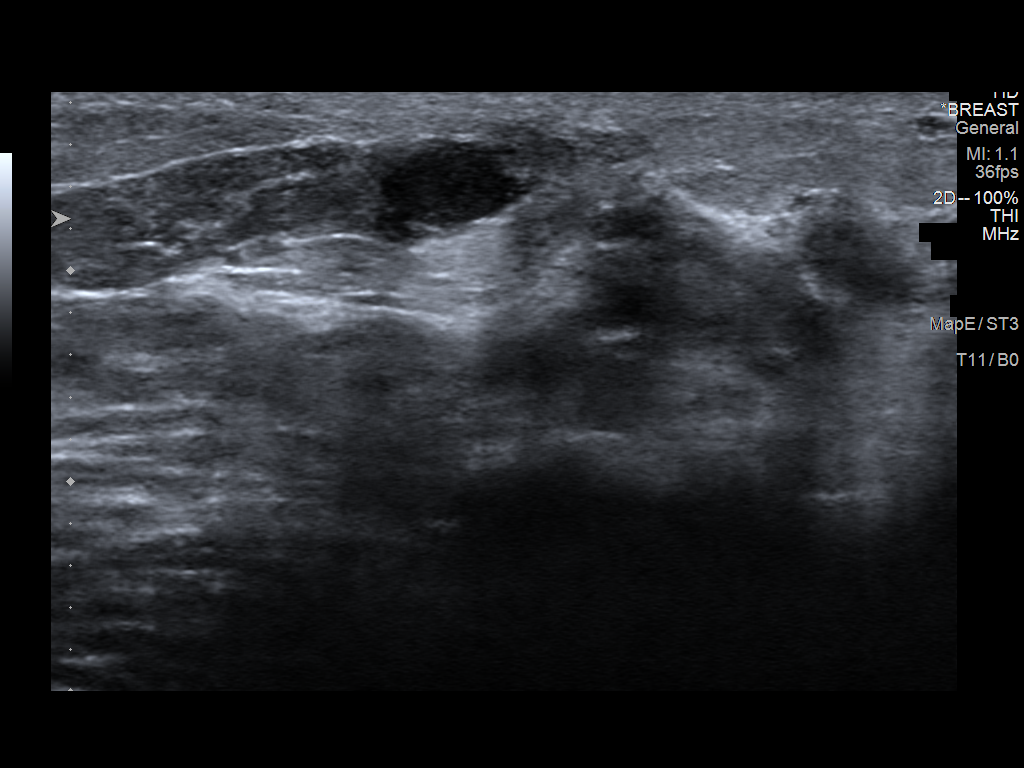
[im 2/6]
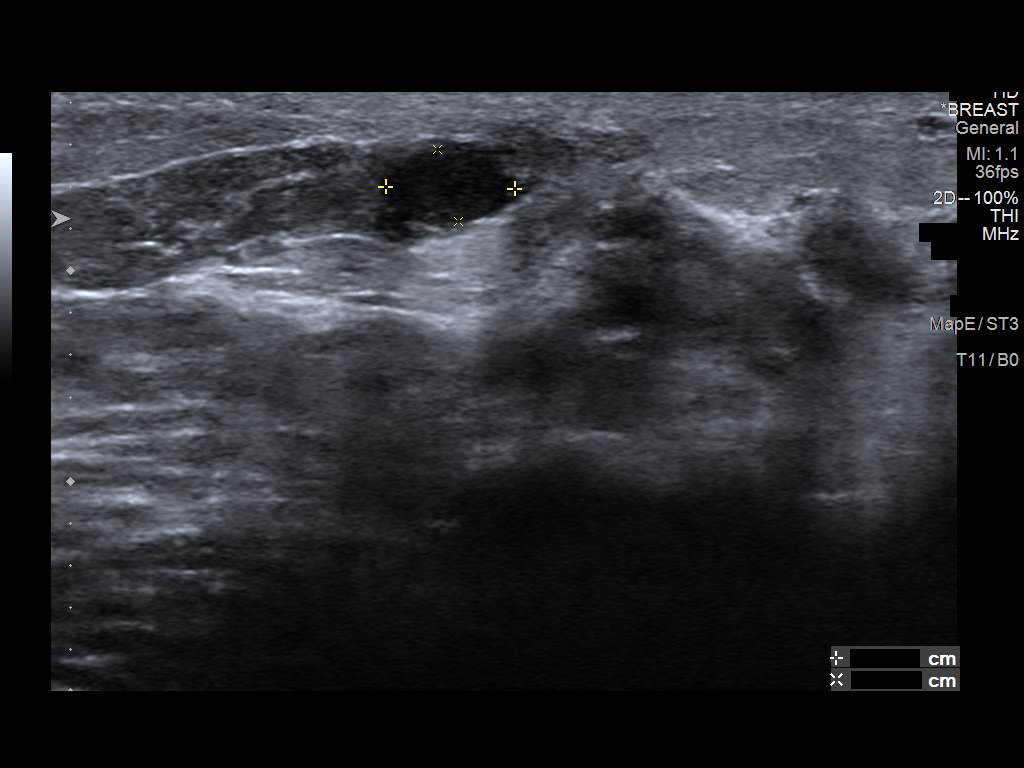
[im 3/6]
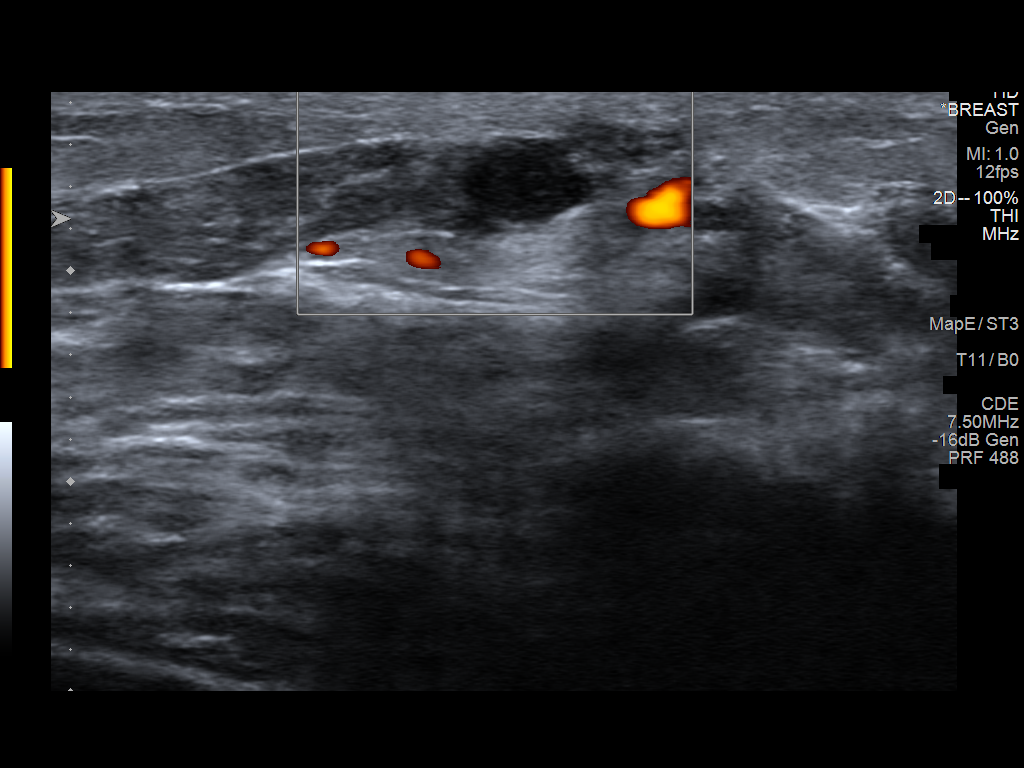
[im 4/6]
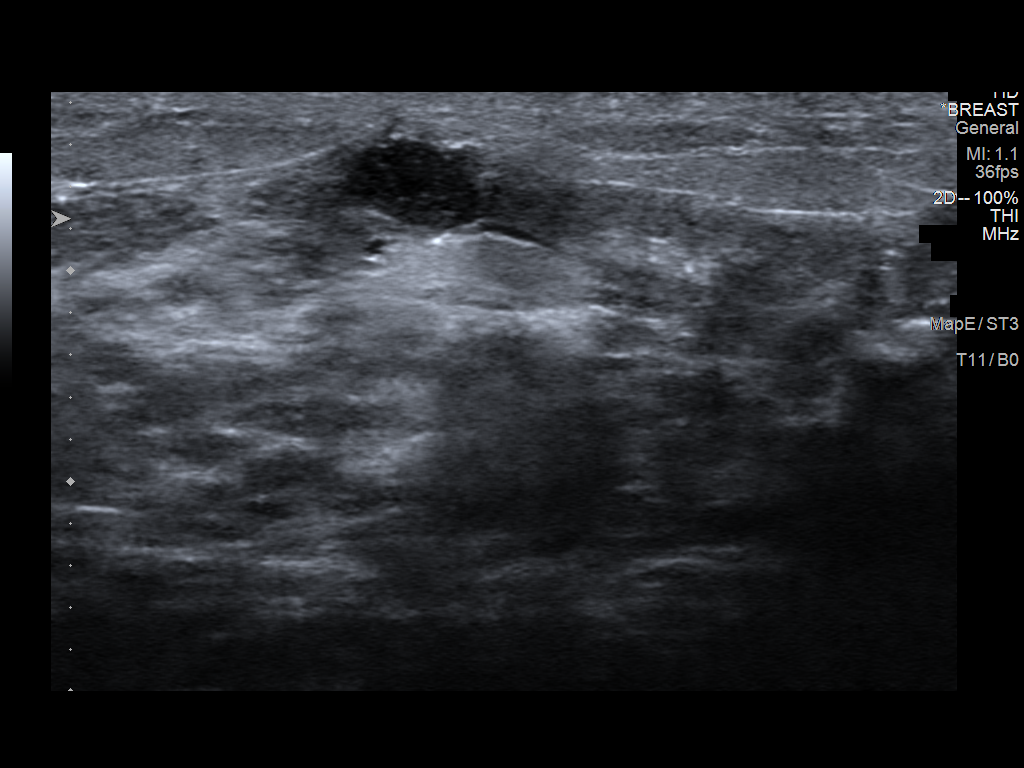
[im 5/6]
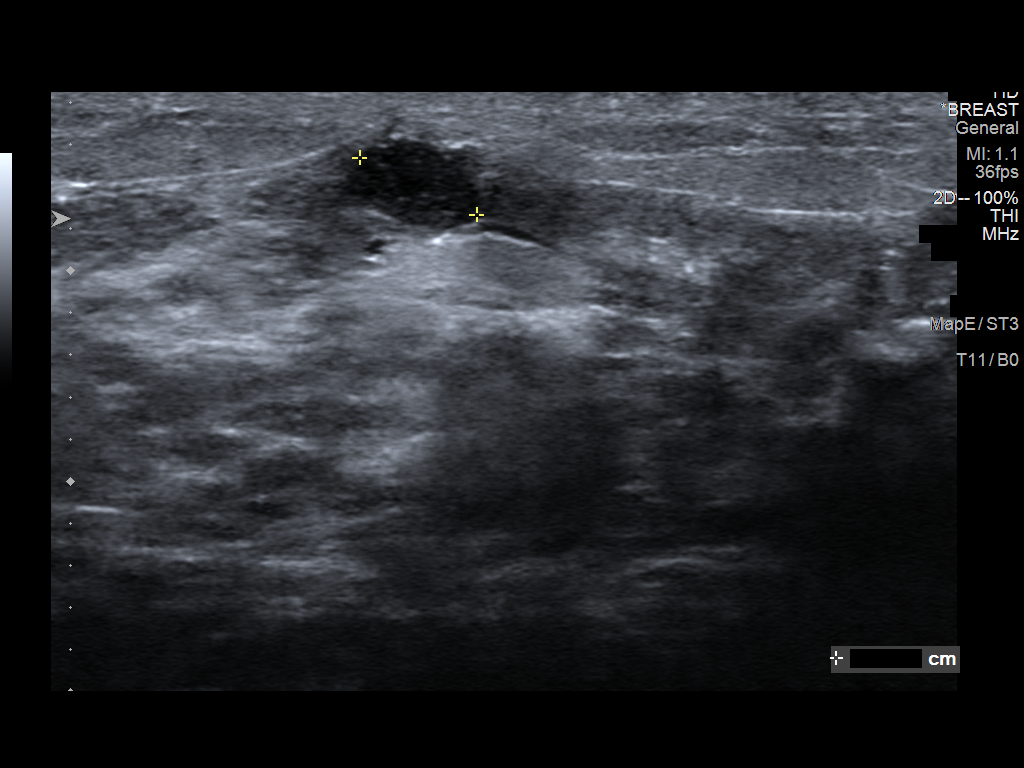
[im 6/6]
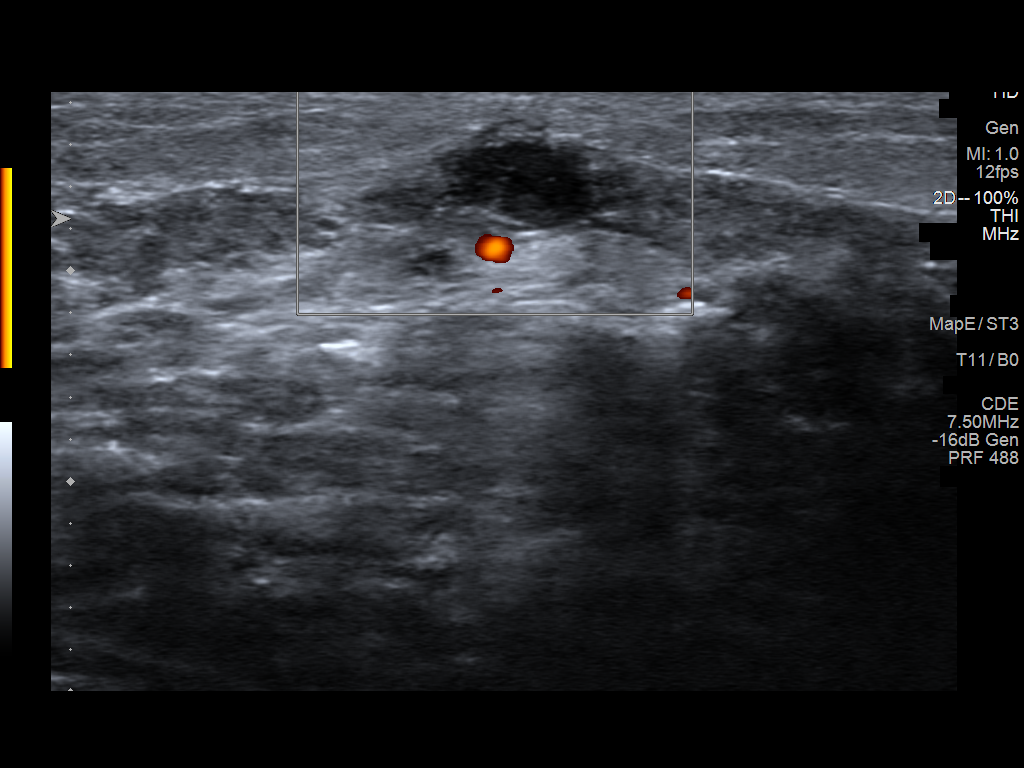

[6 of 6 positions shown; findings below may reference images not displayed]

FINDINGS: Patient states that clinical symptoms related to the previously
supposed mastitis have resolved.

Targeted ultrasound is performed, showing an oval hypoechoic mass in
the RIGHT breast at the 10 o'clock axis, 5 cm from the nipple,
measuring 6 x 4 x 6 mm, without internal vascularity, decreased in
size compared to the previous exam where it measured 1.1 x 0.6 x 1
cm, but less anechoic today suggesting a contracted cyst with
internal debris.
IMPRESSION: Probably benign complicated cyst with internal debris in the RIGHT
breast at the 10 o'clock axis, 5 cm from the nipple, measuring 6 mm,
decreased in size compared to the previous study suggesting interval
contraction of a benign cyst. Recommend additional follow-up RIGHT
breast ultrasound in 3 months to ensure resolution.

RECOMMENDATION:
1. Follow-up RIGHT breast ultrasound in 3 months to ensure
resolution of the probably benign complicated cyst at the 10 o'clock
axis, 5 cm from the nipple, measuring 6 mm. If stable on follow-up,
patient is aware that additional follow-up ultrasound exams may be
necessary to ensure benignity.
2. Patient was instructed to return sooner if the previous clinical
symptoms of skin redness, heat and/or pain return.

I have discussed the findings and recommendations with the patient.
If applicable, a reminder letter will be sent to the patient
regarding the next appointment.

BI-RADS CATEGORY  3: Probably benign.

## 2021-05-25 IMAGING — US US BREAST*R* LIMITED INC AXILLA
1 series · 2 of 2 positions shown · non-contrast
Comparison: Previous exam(s).

CLINICAL DATA: Short-term interval follow-up of a probable benign
complex cyst in the right breast.

EXAM:
ULTRASOUND OF THE RIGHT BREAST

[Series 1: us breast*right* limited inc axilla · 0.06mm/px · 2 of 2 slices shown]
[im 1/2]
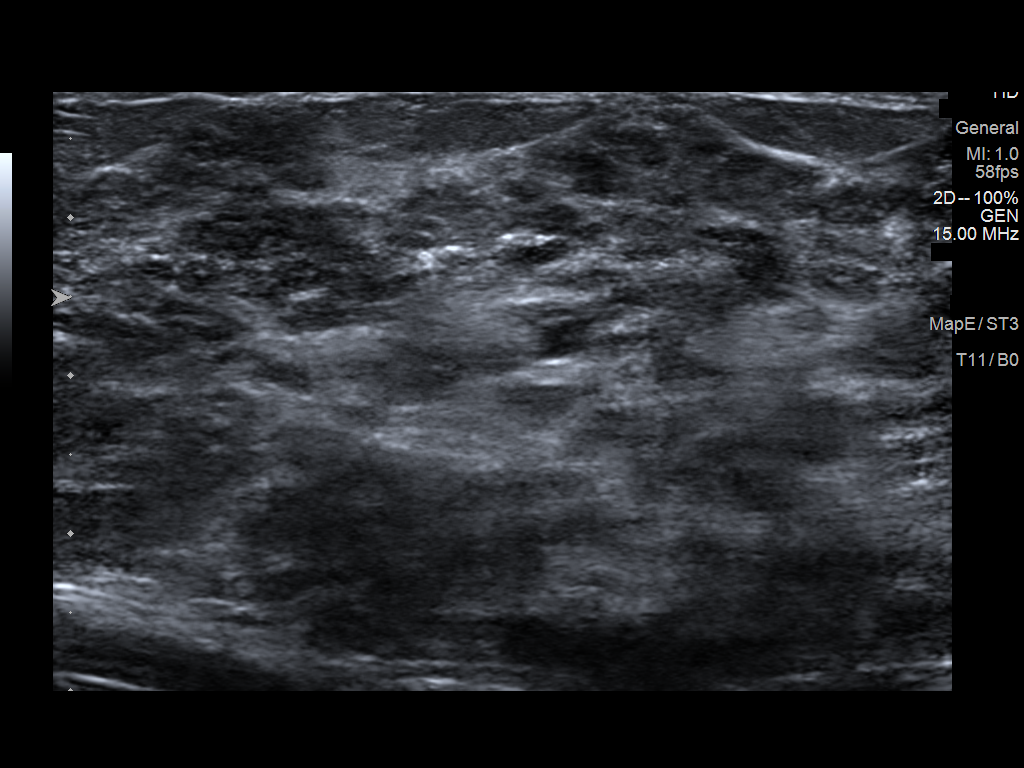
[im 2/2]
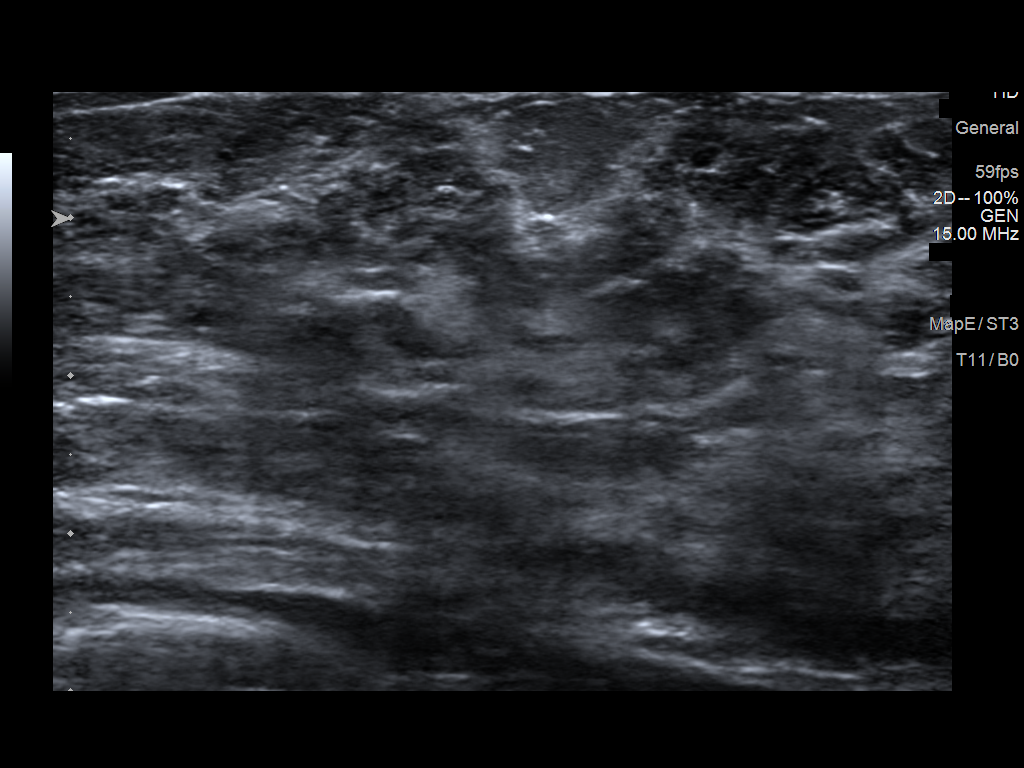

[2 of 2 positions shown; findings below may reference images not displayed]

FINDINGS: Targeted ultrasound is performed, showing normal tissue in the right
breast at 10 o'clock 5 cm from the nipple. The previously seen
complex cyst is no longer visualized.
IMPRESSION: No sonographic evidence of malignancy in the right breast at 10
o'clock 5 cm from the nipple. Resolution of the complex cyst.

RECOMMENDATION:
If the clinical exam remains benign/stable screening mammography can
be deferred until the age of 40.

I have discussed the findings and recommendations with the patient.
If applicable, a reminder letter will be sent to the patient
regarding the next appointment.

BI-RADS CATEGORY  1: Negative.
